# Patient Record
Sex: Female | Born: 1985
Health system: Southern US, Community
[De-identification: ages and names within clinical notes are randomized; demographics above are authoritative.]

## PROBLEM LIST (undated history)

## (undated) DIAGNOSIS — K219 Gastro-esophageal reflux disease without esophagitis: Secondary | ICD-10-CM

## (undated) DIAGNOSIS — R87629 Unspecified abnormal cytological findings in specimens from vagina: Secondary | ICD-10-CM

## (undated) DIAGNOSIS — Z789 Other specified health status: Secondary | ICD-10-CM

## (undated) DIAGNOSIS — Z309 Encounter for contraceptive management, unspecified: Secondary | ICD-10-CM

## (undated) DIAGNOSIS — J324 Chronic pansinusitis: Secondary | ICD-10-CM

## (undated) DIAGNOSIS — J45909 Unspecified asthma, uncomplicated: Secondary | ICD-10-CM

## (undated) DIAGNOSIS — IMO0002 Reserved for concepts with insufficient information to code with codable children: Secondary | ICD-10-CM

## (undated) HISTORY — PX: NO PAST SURGERIES: SHX2092

## (undated) HISTORY — PX: COLPOSCOPY: SHX161

## (undated) HISTORY — DX: Unspecified abnormal cytological findings in specimens from vagina: R87.629

## (undated) HISTORY — DX: Encounter for contraceptive management, unspecified: Z30.9

## (undated) HISTORY — DX: Chronic pansinusitis: J32.4

---

## 2008-04-03 ENCOUNTER — Other Ambulatory Visit: Admission: RE | Admit: 2008-04-03 | Discharge: 2008-04-03 | Payer: Self-pay | Admitting: Obstetrics and Gynecology

## 2008-10-09 ENCOUNTER — Other Ambulatory Visit: Admission: RE | Admit: 2008-10-09 | Discharge: 2008-10-09 | Payer: Self-pay | Admitting: Obstetrics and Gynecology

## 2009-04-25 ENCOUNTER — Other Ambulatory Visit: Admission: RE | Admit: 2009-04-25 | Discharge: 2009-04-25 | Payer: Self-pay | Admitting: Obstetrics and Gynecology

## 2009-12-24 ENCOUNTER — Other Ambulatory Visit: Admission: RE | Admit: 2009-12-24 | Discharge: 2009-12-24 | Payer: Self-pay | Admitting: Obstetrics and Gynecology

## 2010-07-08 ENCOUNTER — Other Ambulatory Visit: Admission: RE | Admit: 2010-07-08 | Discharge: 2010-07-08 | Payer: Self-pay | Admitting: Obstetrics and Gynecology

## 2010-09-24 LAB — RPR: RPR: NONREACTIVE

## 2010-09-24 LAB — ABO/RH: RH Type: POSITIVE

## 2010-12-15 NOTE — L&D Delivery Note (Signed)
Delivery Note At 11:56 PM a viable and healthy female was delivered via Vaginal, Spontaneous Delivery (Presentation loa ;  ).  APGAR: 6, 9; weight 7 lb 6.5 oz (3360 g).   Placenta status: Intact, Spontaneous.  Cord: 3 vessels with the following complications: None.  Cord pH: n/a  Anesthesia:  local Episiotomy: None Lacerations: Periurethral;2nd degree quite deep on rt side of urethra, through clitoral hood, repaired with 3 interrupted sutures.  Left side required only one suture of 4-0 vicryl Suture Repair: vicryl 4-0 Est. Blood Loss (mL):   Mom to postpartum.  Baby to nursery-stable. Delivery with DR Campbell Stall 07/03/2011, 12:24 AM

## 2011-07-02 ENCOUNTER — Encounter (HOSPITAL_COMMUNITY): Payer: Self-pay

## 2011-07-02 ENCOUNTER — Inpatient Hospital Stay (HOSPITAL_COMMUNITY)
Admission: AD | Admit: 2011-07-02 | Discharge: 2011-07-04 | DRG: 775 | Disposition: A | Discharge status: Home / Self Care | Payer: Self-pay | Source: Ambulatory Visit | Attending: Obstetrics and Gynecology | Admitting: Obstetrics and Gynecology

## 2011-07-02 DIAGNOSIS — IMO0001 Reserved for inherently not codable concepts without codable children: Secondary | ICD-10-CM

## 2011-07-02 DIAGNOSIS — Z3490 Encounter for supervision of normal pregnancy, unspecified, unspecified trimester: Secondary | ICD-10-CM

## 2011-07-02 HISTORY — DX: Other specified health status: Z78.9

## 2011-07-02 HISTORY — DX: Reserved for concepts with insufficient information to code with codable children: IMO0002

## 2011-07-02 LAB — RPR: RPR Ser Ql: NONREACTIVE

## 2011-07-02 LAB — CBC
Platelets: 260 10*3/uL (ref 150–400)
RDW: 12.2 % (ref 11.5–15.5)
WBC: 12.8 10*3/uL — ABNORMAL HIGH (ref 4.0–10.5)

## 2011-07-02 MED ORDER — LACTATED RINGERS IV SOLN: 500.0000 mL | INTRAVENOUS | Status: DC | PRN

## 2011-07-02 MED ORDER — FENTANYL CITRATE 0.05 MG/ML IJ SOLN: 100.0000 ug | INTRAMUSCULAR | Status: DC | PRN

## 2011-07-02 MED ORDER — OXYTOCIN 20 UNITS IN LACTATED RINGERS INFUSION - SIMPLE
1.0000 m[IU]/min | INTRAVENOUS | Status: DC
  Administered 2011-07-02: 2 m[IU]/min via INTRAVENOUS

## 2011-07-02 MED ORDER — ACETAMINOPHEN 325 MG PO TABS: 650.0000 mg | ORAL_TABLET | ORAL | Status: DC | PRN

## 2011-07-02 MED ORDER — FLEET ENEMA 7-19 GM/118ML RE ENEM: 1.0000 | ENEMA | RECTAL | Status: DC | PRN

## 2011-07-02 MED ORDER — IBUPROFEN 600 MG PO TABS
600.0000 mg | ORAL_TABLET | Freq: Four times a day (QID) | ORAL | Status: DC | PRN
  Filled 2011-07-02: qty 1

## 2011-07-02 MED ORDER — PRENATAL PLUS 27-1 MG PO TABS: 1.0000 | ORAL_TABLET | Freq: Every day | ORAL | Status: DC

## 2011-07-02 MED ORDER — TERBUTALINE SULFATE 1 MG/ML IJ SOLN: 0.2500 mg | Freq: Once | INTRAMUSCULAR | Status: AC | PRN

## 2011-07-02 MED ORDER — LIDOCAINE HCL (PF) 1 % IJ SOLN
30.0000 mL | INTRAMUSCULAR | Status: DC | PRN
  Filled 2011-07-02: qty 30

## 2011-07-02 MED ORDER — OXYTOCIN 20 UNITS IN LACTATED RINGERS INFUSION - SIMPLE
125.0000 mL/h | Freq: Once | INTRAVENOUS | Status: AC
  Administered 2011-07-03: 999 mL/h via INTRAVENOUS

## 2011-07-02 MED ORDER — OXYTOCIN 20 UNITS IN LACTATED RINGERS INFUSION - SIMPLE
INTRAVENOUS | Status: AC
  Administered 2011-07-02: 2 m[IU]/min via INTRAVENOUS
  Filled 2011-07-02: qty 1000

## 2011-07-02 MED ORDER — CITRIC ACID-SODIUM CITRATE 334-500 MG/5ML PO SOLN: 30.0000 mL | ORAL | Status: DC | PRN

## 2011-07-02 MED ORDER — OXYCODONE-ACETAMINOPHEN 5-325 MG PO TABS
2.0000 | ORAL_TABLET | ORAL | Status: DC | PRN
  Administered 2011-07-03: 1 via ORAL
  Filled 2011-07-02: qty 1

## 2011-07-02 MED ORDER — ONDANSETRON HCL 4 MG/2ML IJ SOLN: 4.0000 mg | Freq: Four times a day (QID) | INTRAMUSCULAR | Status: DC | PRN

## 2011-07-02 MED ORDER — LIDOCAINE HCL (PF) 1 % IJ SOLN
INTRAMUSCULAR | Status: AC
  Filled 2011-07-02: qty 30

## 2011-07-02 MED ORDER — LACTATED RINGERS IV SOLN
INTRAVENOUS | Status: DC
  Administered 2011-07-02 (×2): via INTRAVENOUS

## 2011-07-02 NOTE — Progress Notes (Signed)
Pt is here for labor eval. Denies lof . Report s good fetal movement.

## 2011-07-02 NOTE — Plan of Care (Signed)
Problem: Consults Goal: Birthing Suites Patient Information Press F2 to bring up selections list  Outcome: Completed/Met Date Met:  07/02/11  Pt 37-[redacted] weeks EGA     

## 2011-07-02 NOTE — Progress Notes (Signed)
Genelda Reist is a 25 y.o. G2P0010 at [redacted]w[redacted]d by LMP admitted for active labor  Subjective:  Patient tolerating labor well without epidural. AROM clear.  No urge to push.    Objective: BP 120/70  Pulse 76  Temp(Src) 97.8 F (36.6 C) (Axillary)  Resp 20  Ht 5' 5.5" (1.664 m)  Wt 76.204 kg (168 lb)  BMI 27.53 kg/m2      FHT:  FHR: 140 bpm, variability: moderate,  accelerations:  Abscent,  decelerations:  Absent UC:   regular, every 3 minutes SVE:   Dilation: 10 Effacement (%): 100 Station: -1 Exam by:: Dr. Emelda Fear  Labs: Lab Results  Component Value Date   WBC 12.8* 07/02/2011   HGB 12.3 07/02/2011   HCT 34.8* 07/02/2011   MCV 97.5 07/02/2011   PLT 260 07/02/2011    Assessment / Plan: Spontaneous labor, progressing normally  Labor: Progressing normally Preeclampsia:  n/a Fetal Wellbeing:  Category I Pain Control:  Labor support without medications I/D:  n/a Anticipated MOD:  NSVD  Talley Casco V 07/02/2011, 8:46 PM

## 2011-07-02 NOTE — H&P (Signed)
Sara Hamilton is a 25 y.o. female at [redacted]w[redacted]d presenting for labor. Maternal Medical History:  Reason for admission: Reason for admission: contractions.  Contractions: Onset was 3-5 hours ago.   Frequency: regular.   Perceived severity is strong.    Fetal activity: Perceived fetal activity is normal.   Last perceived fetal movement was within the past hour.    Prenatal complications: Bleeding: spotting x 1 hr.   Prenatal Complications - Diabetes: none.    OB History    Grav Para Term Preterm Abortions TAB SAB Ect Mult Living   2    1          Past Medical History  Diagnosis Date  . Abnormal Pap smear    Past Surgical History  Procedure Date  . Colposcopy    Family History: family history includes Cancer in her maternal grandmother and Heart defect in her sister. Social History:  reports that she has never smoked. She does not have any smokeless tobacco history on file. She reports that she does not drink alcohol or use illicit drugs.  Review of Systems  Constitutional: Negative for fever and chills.  Respiratory: Negative.   Cardiovascular: Negative.   Gastrointestinal: Negative.   Genitourinary:       + for spotting and contractions  Musculoskeletal: Negative.   Neurological: Negative.   Psychiatric/Behavioral: Negative.     Dilation: 7 Effacement (%): 90 Station: -1 Exam by:: Will Schier cnm Blood pressure 124/80, pulse 80, temperature 98.3 F (36.8 C), temperature source Oral, resp. rate 16, height 5' 5.5" (1.664 m), weight 76.204 kg (168 lb). Maternal Exam:  Uterine Assessment: Contraction strength is firm.  Contraction frequency is regular.   Introitus: Normal vulva. Ferning test: not done.  Nitrazine test: not done. Amniotic fluid character: not assessed.  Pelvis: adequate for delivery.   Cervix: Cervix evaluated by digital exam.   7/90/-1/BOWI  Fetal Exam Fetal Monitor Review: Baseline rate: 160.  Variability: minimal (<5 bpm).   Pattern: no  accelerations.    Fetal State Assessment: Category II - tracings are indeterminate.     Physical Exam  Nursing note and vitals reviewed. Constitutional: She is oriented to person, place, and time. She appears well-developed and well-nourished. No distress.  Cardiovascular: Normal rate.   Respiratory: Effort normal.  GI: Soft. There is no tenderness.  Neurological: She is alert and oriented to person, place, and time.  Skin: Skin is warm and dry.    Prenatal labs: ABO, Rh:   O pos   Antibody: Negative (10/11 0000) Rubella:  immune RPR: Nonreactive (10/11 0000)  HBsAg: Negative (10/11 0000)  HIV: Non-reactive (10/11 0000)  GBS: Negative (07/13 0000)   Assessment/Plan: 25 yo G1P0 @ [redacted] wks EGA in active labor, stable status Admit to L&D with routine labor orders, anticipate SVD   Sara Hamilton 07/02/2011, 5:46 PM

## 2011-07-02 NOTE — Progress Notes (Signed)
Irregular contractions yesterday not bad, today stronger and closer together every 4 minutes, G1   38 weeks, spotting about one hour ago

## 2011-07-03 ENCOUNTER — Encounter (HOSPITAL_COMMUNITY): Payer: Self-pay | Admitting: *Deleted

## 2011-07-03 LAB — CBC
HCT: 30.1 % — ABNORMAL LOW (ref 36.0–46.0)
Hemoglobin: 10.3 g/dL — ABNORMAL LOW (ref 12.0–15.0)
MCV: 98 fL (ref 78.0–100.0)
RDW: 12.3 % (ref 11.5–15.5)
WBC: 14.8 10*3/uL — ABNORMAL HIGH (ref 4.0–10.5)

## 2011-07-03 MED ORDER — LANOLIN HYDROUS EX OINT: TOPICAL_OINTMENT | CUTANEOUS | Status: DC | PRN

## 2011-07-03 MED ORDER — SODIUM CHLORIDE 0.9 % IJ SOLN
3.0000 mL | Freq: Two times a day (BID) | INTRAMUSCULAR | Status: DC
Start: 2011-07-03 — End: 2011-07-03

## 2011-07-03 MED ORDER — SODIUM CHLORIDE 0.9 % IV SOLN: 250.0000 mL | INTRAVENOUS | Status: DC

## 2011-07-03 MED ORDER — OXYTOCIN 20 UNITS IN LACTATED RINGERS INFUSION - SIMPLE: 1.0000 m[IU]/min | INTRAVENOUS | Status: DC

## 2011-07-03 MED ORDER — TETANUS-DIPHTH-ACELL PERTUSSIS 5-2.5-18.5 LF-MCG/0.5 IM SUSP
0.5000 mL | Freq: Once | INTRAMUSCULAR | Status: AC
  Administered 2011-07-04: 0.5 mL via INTRAMUSCULAR
  Filled 2011-07-03 (×2): qty 0.5

## 2011-07-03 MED ORDER — SIMETHICONE 80 MG PO CHEW: 80.0000 mg | CHEWABLE_TABLET | ORAL | Status: DC | PRN

## 2011-07-03 MED ORDER — ONDANSETRON HCL 4 MG PO TABS: 4.0000 mg | ORAL_TABLET | ORAL | Status: DC | PRN

## 2011-07-03 MED ORDER — TETANUS-DIPHTH-ACELL PERTUSSIS 5-2.5-18.5 LF-MCG/0.5 IM SUSP
0.5000 mL | Freq: Once | INTRAMUSCULAR | Status: DC
  Filled 2011-07-03: qty 0.5

## 2011-07-03 MED ORDER — PRENATAL PLUS 27-1 MG PO TABS
1.0000 | ORAL_TABLET | Freq: Every day | ORAL | Status: DC
  Administered 2011-07-04: 1 via ORAL
  Filled 2011-07-03 (×2): qty 1

## 2011-07-03 MED ORDER — IBUPROFEN 600 MG PO TABS
600.0000 mg | ORAL_TABLET | Freq: Four times a day (QID) | ORAL | Status: DC
  Administered 2011-07-03: 600 mg via ORAL

## 2011-07-03 MED ORDER — ZOLPIDEM TARTRATE 5 MG PO TABS: 5.0000 mg | ORAL_TABLET | Freq: Every evening | ORAL | Status: DC | PRN

## 2011-07-03 MED ORDER — IBUPROFEN 600 MG PO TABS
600.0000 mg | ORAL_TABLET | Freq: Four times a day (QID) | ORAL | Status: DC
  Administered 2011-07-03 – 2011-07-04 (×6): 600 mg via ORAL
  Filled 2011-07-03 (×6): qty 1

## 2011-07-03 MED ORDER — TERBUTALINE SULFATE 1 MG/ML IJ SOLN: 0.2500 mg | Freq: Once | INTRAMUSCULAR | Status: DC | PRN

## 2011-07-03 MED ORDER — OXYCODONE-ACETAMINOPHEN 5-325 MG PO TABS
1.0000 | ORAL_TABLET | ORAL | Status: DC | PRN
  Administered 2011-07-03 (×3): 1 via ORAL
  Filled 2011-07-03 (×4): qty 1

## 2011-07-03 MED ORDER — BENZOCAINE-MENTHOL 20-0.5 % EX AERO: 1.0000 "application " | INHALATION_SPRAY | CUTANEOUS | Status: DC | PRN

## 2011-07-03 MED ORDER — WITCH HAZEL-GLYCERIN EX PADS: MEDICATED_PAD | CUTANEOUS | Status: DC | PRN

## 2011-07-03 MED ORDER — OXYCODONE-ACETAMINOPHEN 5-325 MG PO TABS
1.0000 | ORAL_TABLET | ORAL | Status: DC | PRN
  Administered 2011-07-03: 1 via ORAL

## 2011-07-03 MED ORDER — ONDANSETRON HCL 4 MG/2ML IJ SOLN: 4.0000 mg | INTRAMUSCULAR | Status: DC | PRN

## 2011-07-03 MED ORDER — SENNOSIDES-DOCUSATE SODIUM 8.6-50 MG PO TABS: 1.0000 | ORAL_TABLET | Freq: Every day | ORAL | Status: DC

## 2011-07-03 MED ORDER — SODIUM CHLORIDE 0.9 % IJ SOLN: 3.0000 mL | INTRAMUSCULAR | Status: DC | PRN

## 2011-07-03 MED ORDER — OXYTOCIN 20 UNITS IN LACTATED RINGERS INFUSION - SIMPLE: 125.0000 mL/h | INTRAVENOUS | Status: DC | PRN

## 2011-07-03 MED ORDER — DIPHENHYDRAMINE HCL 25 MG PO CAPS: 25.0000 mg | ORAL_CAPSULE | Freq: Four times a day (QID) | ORAL | Status: DC | PRN

## 2011-07-03 MED ORDER — SENNOSIDES-DOCUSATE SODIUM 8.6-50 MG PO TABS
1.0000 | ORAL_TABLET | Freq: Every day | ORAL | Status: DC
  Administered 2011-07-03: 1 via ORAL

## 2011-07-03 MED ORDER — PRENATAL PLUS 27-1 MG PO TABS
1.0000 | ORAL_TABLET | Freq: Every day | ORAL | Status: DC
  Administered 2011-07-03: 1 via ORAL

## 2011-07-03 MED ORDER — BENZOCAINE-MENTHOL 20-0.5 % EX AERO
INHALATION_SPRAY | CUTANEOUS | Status: AC
  Administered 2011-07-03: 04:00:00
  Filled 2011-07-03: qty 56

## 2011-07-03 NOTE — Progress Notes (Signed)
Post Partum Day 1 Subjective: no complaints and voiding  Objective: Blood pressure 113/59, pulse 82, temperature 98 F (36.7 C), temperature source Oral, resp. rate 18, height 5' 5.5" (1.664 m), weight 76.204 kg (168 lb), unknown if currently breastfeeding.  Physical Exam:  General: alert, cooperative and no distress Lochia: appropriate Uterine Fundus: firm Incision: healing well at labial laceration DVT Evaluation: No evidence of DVT seen on physical exam.   Basename 07/03/11 0520 07/02/11 1555  HGB 10.3* 12.3  HCT 30.1* 34.8*    Assessment/Plan: Plan for discharge tomorrow/ bottle fdg/ bcp's   LOS: 1 day   Joslyne Marshburn V 07/03/2011, 8:48 AM

## 2011-07-03 NOTE — Discharge Summary (Signed)
Obstetric Discharge Summary Reason for Admission: onset of labor Prenatal Procedures: ultrasound Intrapartum Procedures: spontaneous vaginal delivery Postpartum Procedures: none Complications-Operative and Postpartum: none  Hemoglobin  Date Value Range Status  07/03/2011 10.3* 12.0-15.0 (g/dL) Final     HCT  Date Value Range Status  07/03/2011 30.1* 36.0-46.0 (%) Final    Discharge Diagnoses: Term Pregnancy-delivered  Discharge Information: Date: 07/03/2011 Activity: unrestricted Diet: routine Medications: Ibuprophen Condition: stable Instructions: refer to practice specific booklet and see avs Discharge to: home   Newborn Data: Live born  Information for the patient's newborn:  Louellen, Haldeman [[id number]]  female ; APGAR ,6/9 ; weight ; 7#6oz  CRESENZO-DISHMAN,Valree Feild 07/03/2011, 9:44 PM

## 2011-07-03 NOTE — Progress Notes (Signed)
UR chart review completed.  

## 2011-07-04 MED ORDER — IBUPROFEN 600 MG PO TABS: 600.0000 mg | ORAL_TABLET | Freq: Four times a day (QID) | ORAL | Status: AC

## 2011-07-04 MED ORDER — DOCUSATE SODIUM 100 MG PO CAPS: 100.0000 mg | ORAL_CAPSULE | Freq: Two times a day (BID) | ORAL | Status: AC

## 2011-07-04 NOTE — Progress Notes (Signed)
Infant in NICU and mother decided to pump and give breast milk. Gave lactation handbook along with nicu pumping handout . Mother and father teary about newborn in nicu. Mom has pumped one time with #24 flanges. She states she saw small amt of colostrum .Reviewed benefits of breastfeeding becauses mother wasn't planning to breastfeed. Encouraged to do skin to skjin with infant and begin breastfeeding as soon as allowed in nicu. Reviewed pumping every 3 hrs for 15 mins. inst to try power pumping when she gets home. Discussed pump rental or purchasing a new pump. Plans to go to store to look at new pumps. Encouraged to call lactation office for out patient visit  before infant goes home.

## 2011-07-04 NOTE — Progress Notes (Signed)
Post Partum Day 2 Subjective: no complaints, up ad lib, voiding, tolerating PO and + flatus  Objective: Blood pressure 115/73, pulse 77, temperature 98 F (36.7 C), temperature source Oral, resp. rate 18, height 5' 5.5" (1.664 m), weight 76.204 kg (168 lb), unknown if currently breastfeeding.  Physical Exam:  General: alert, cooperative and no distress Lochia: appropriate Uterine Fundus: firm Incision:  DVT Evaluation: No evidence of DVT seen on physical exam.   Basename 07/03/11 0520 07/02/11 1555  HGB 10.3* 12.3  HCT 30.1* 34.8*    Assessment/Plan: Pt strongly wants to stay one more night because her baby is in the NICU.  Pt is self pay. Will have care management talk with pt   LOS: 2 days   CRESENZO-DISHMAN,Marinus Eicher 07/04/2011, 8:04 AM

## 2011-07-08 NOTE — Progress Notes (Signed)
Pre and post weight done with breast feeding - infant transferred 6 mls. Mom's breast soft, milk dripping. Infant seems to be slipping off latch, not transferring despite 40 minutes of vigorous sucking at breast. Will do follow up tomorrow, and use nipple shield if necessary

## 2011-10-30 ENCOUNTER — Other Ambulatory Visit (HOSPITAL_COMMUNITY)
Admission: RE | Admit: 2011-10-30 | Discharge: 2011-10-30 | Disposition: A | Discharge status: Home / Self Care | Payer: 59 | Source: Ambulatory Visit | Attending: Obstetrics and Gynecology | Admitting: Obstetrics and Gynecology

## 2011-10-30 ENCOUNTER — Other Ambulatory Visit: Payer: Self-pay | Admitting: Adult Health

## 2011-10-30 DIAGNOSIS — Z01419 Encounter for gynecological examination (general) (routine) without abnormal findings: Secondary | ICD-10-CM | POA: Insufficient documentation

## 2012-11-03 ENCOUNTER — Other Ambulatory Visit: Payer: Self-pay | Admitting: Adult Health

## 2012-11-03 ENCOUNTER — Other Ambulatory Visit (HOSPITAL_COMMUNITY)
Admission: RE | Admit: 2012-11-03 | Discharge: 2012-11-03 | Disposition: A | Discharge status: Home / Self Care | Payer: 59 | Source: Ambulatory Visit | Attending: Obstetrics and Gynecology | Admitting: Obstetrics and Gynecology

## 2012-11-03 DIAGNOSIS — Z01419 Encounter for gynecological examination (general) (routine) without abnormal findings: Secondary | ICD-10-CM | POA: Insufficient documentation

## 2013-11-07 ENCOUNTER — Encounter: Payer: Self-pay | Admitting: Adult Health

## 2013-11-07 ENCOUNTER — Ambulatory Visit (INDEPENDENT_AMBULATORY_CARE_PROVIDER_SITE_OTHER): Payer: 59 | Admitting: Adult Health

## 2013-11-07 VITALS — BP 120/78 | HR 72 | Ht 65.0 in | Wt 145.0 lb

## 2013-11-07 DIAGNOSIS — Z01419 Encounter for gynecological examination (general) (routine) without abnormal findings: Secondary | ICD-10-CM

## 2013-11-07 DIAGNOSIS — Z309 Encounter for contraceptive management, unspecified: Secondary | ICD-10-CM | POA: Insufficient documentation

## 2013-11-07 MED ORDER — NORETHIN ACE-ETH ESTRAD-FE 1.5-30 MG-MCG PO TABS: 1.0000 | ORAL_TABLET | Freq: Every day | ORAL | Status: DC

## 2013-11-07 NOTE — Patient Instructions (Signed)
Physical in 1 year Mammogram at 39 Continue pills

## 2013-11-07 NOTE — Progress Notes (Signed)
Patient ID: Sara Hamilton, female   DOB: Nov 22, 1986, 27 y.o.   MRN: [id number] History of Present Illness: Sara Hamilton is a 27 year old white female, married.Had normal pap 11/03/12.   Current Medications, Allergies, Past Medical History, Past Surgical History, Family History and Social History were reviewed in [employer] record.   Past Medical History  Diagnosis Date  . Abnormal Pap smear   . No pertinent past medical history   . Contraceptive management 11/07/2013  Current outpatient prescriptions:norethindrone-ethinyl estradiol-iron (MICROGESTIN FE 1.5/30) 1.5-30 MG-MCG tablet, Take 1 tablet by mouth daily., Disp: 1 Package, Rfl: 11  Past Surgical History  Procedure Laterality Date  . Colposcopy    . No past surgeries      Review of Systems: Patient denies any headaches, blurred vision, shortness of breath, chest pain, abdominal pain, problems with bowel movements, urination, or intercourse. No joint pain or moods wings.May want another baby in future.   Physical Exam:BP 120/78  Pulse 72  Ht 5\' 5"  (1.651 m)  Wt 145 lb (65.772 kg)  BMI 24.13 kg/m2  LMP 10/24/2013  Breastfeeding? No General:  Well developed, well nourished, no acute distress Skin:  Warm and dry Neck:  Midline trachea, normal thyroid Lungs; Clear to auscultation bilaterally Breast:  No dominant palpable mass, retraction, or nipple discharge Cardiovascular: Regular rate and rhythm Abdomen:  Soft, non tender, no hepatosplenomegaly Pelvic:  External genitalia is normal in appearance.  The vagina is normal in appearance.  The cervix is bulbous.  Uterus is felt to be normal size, shape, and contour.  No  adnexal masses or tenderness noted. Extremities:  No swelling or varicosities noted Psych:  No mood changes, alert and cooperative, seems happy   Impression: Yearly exam no pap Contraceptive management    Plan: Physical and pap in 1 year Refilled Loestrin 1.5/30 x 1 year When decides to try  to get pregnant, stop pills for 3 months prior to trying and take prenatal vitamin with folic acid

## 2014-03-28 ENCOUNTER — Other Ambulatory Visit: Payer: Self-pay | Admitting: Adult Health

## 2014-10-16 ENCOUNTER — Encounter: Payer: Self-pay | Admitting: Adult Health

## 2015-05-20 ENCOUNTER — Other Ambulatory Visit: Payer: Self-pay | Admitting: Adult Health

## 2015-05-21 ENCOUNTER — Other Ambulatory Visit: Payer: Self-pay | Admitting: Adult Health

## 2015-07-12 ENCOUNTER — Encounter: Payer: Self-pay | Admitting: Adult Health

## 2015-07-12 ENCOUNTER — Ambulatory Visit (INDEPENDENT_AMBULATORY_CARE_PROVIDER_SITE_OTHER): Payer: 59 | Admitting: Adult Health

## 2015-07-12 ENCOUNTER — Other Ambulatory Visit (HOSPITAL_COMMUNITY)
Admission: RE | Admit: 2015-07-12 | Discharge: 2015-07-12 | Disposition: A | Discharge status: Home / Self Care | Payer: 59 | Source: Ambulatory Visit | Attending: Adult Health | Admitting: Adult Health

## 2015-07-12 VITALS — BP 110/70 | HR 72 | Ht 65.5 in | Wt 145.0 lb

## 2015-07-12 DIAGNOSIS — Z1151 Encounter for screening for human papillomavirus (HPV): Secondary | ICD-10-CM | POA: Insufficient documentation

## 2015-07-12 DIAGNOSIS — Z01419 Encounter for gynecological examination (general) (routine) without abnormal findings: Secondary | ICD-10-CM | POA: Diagnosis not present

## 2015-07-12 DIAGNOSIS — Z3041 Encounter for surveillance of contraceptive pills: Secondary | ICD-10-CM

## 2015-07-12 MED ORDER — NORETHIN ACE-ETH ESTRAD-FE 1.5-30 MG-MCG PO TABS: 1.0000 | ORAL_TABLET | Freq: Every day | ORAL | Status: DC

## 2015-07-12 NOTE — Progress Notes (Signed)
Patient ID: Sara Hamilton, female   DOB: [birth year], 29 y.o.   MRN: [id number] History of Present Illness:  Sara Hamilton is a 29 year old white female, married in for well woman gyn exam and pap, no complaints.Needs OCs refilled.G2 P1, son is 92 yo.  Current Medications, Allergies, Past Medical History, Past Surgical History, Family History and Social History were reviewed in [employer] record.     Review of Systems: Patient denies any headaches, hearing loss, fatigue, blurred vision, shortness of breath, chest pain, abdominal pain, problems with bowel movements, urination, or intercourse. No joint pain or mood swings.    Physical Exam:BP 110/70 mmHg  Pulse 72  Ht 5' 5.5" (1.664 m)  Wt 145 lb (65.772 kg)  BMI 23.75 kg/m2  LMP 06/21/2015 General:  Well developed, well nourished, no acute distress Skin:  Warm and dry Neck:  Midline trachea, normal thyroid, good ROM, no lymphadenopathy Lungs; Clear to auscultation bilaterally Breast:  No dominant palpable mass, retraction, or nipple discharge Cardiovascular: Regular rate and rhythm Abdomen:  Soft, non tender, no hepatosplenomegaly Pelvic:  External genitalia is normal in appearance, no lesions.  The vagina is normal in appearance. Urethra has no lesions or masses. The cervix is bulbous, everted at os, pap with HPV performed.  Uterus is felt to be normal size, shape, and contour.  No adnexal masses or tenderness noted.Bladder is non tender, no masses felt. Extremities/musculoskeletal:  No swelling or varicosities noted, no clubbing or cyanosis Psych:  No mood changes, alert and cooperative,seems happy   Impression: Well woman gyn exam with pap Contraceptive management    Plan: Refilled microgestin 1.5/30 fe x  1 year Physical in 1 year Pap in 3 if normal with negative HPV Fasting labs next year Mammogram at 39

## 2015-07-12 NOTE — Patient Instructions (Signed)
Physical in 1 year Pap in 3 years if normal with negative HPV Mammogram at 57

## 2015-07-17 LAB — CYTOLOGY - PAP

## 2016-05-20 ENCOUNTER — Other Ambulatory Visit: Payer: Self-pay | Admitting: Adult Health

## 2016-11-14 ENCOUNTER — Other Ambulatory Visit: Payer: 59 | Admitting: Adult Health

## 2016-11-27 ENCOUNTER — Ambulatory Visit (INDEPENDENT_AMBULATORY_CARE_PROVIDER_SITE_OTHER): Payer: BLUE CROSS/BLUE SHIELD | Admitting: Adult Health

## 2016-11-27 ENCOUNTER — Encounter: Payer: Self-pay | Admitting: Adult Health

## 2016-11-27 VITALS — BP 122/68 | HR 70 | Ht 66.25 in | Wt 167.5 lb

## 2016-11-27 DIAGNOSIS — Z01419 Encounter for gynecological examination (general) (routine) without abnormal findings: Secondary | ICD-10-CM | POA: Diagnosis not present

## 2016-11-27 DIAGNOSIS — Z3041 Encounter for surveillance of contraceptive pills: Secondary | ICD-10-CM

## 2016-11-27 MED ORDER — NORETHIN ACE-ETH ESTRAD-FE 1.5-30 MG-MCG PO TABS: 1.0000 | ORAL_TABLET | Freq: Every day | ORAL | 4 refills | Status: DC

## 2016-11-27 NOTE — Progress Notes (Signed)
Patient ID: Sara Hamilton, female   DOB: Aug 30, 1986, 30 y.o.   MRN: [id number] History of Present Illness: Sara Hamilton is a 30 year old white female,married in for a well woman gyn exam and refill OCs.Had normal pap with negative HPV 07/12/15.   Current Medications, Allergies, Past Medical History, Past Surgical History, Family History and Social History were reviewed in [facility] electronic medical record.     Review of Systems: Patient denies any headaches, hearing loss, fatigue, blurred vision, shortness of breath, chest pain, abdominal pain, problems with bowel movements, urination, or intercourse. No joint pain or mood swings.Has gained some weight, husband has too.    Physical Exam:BP 122/68 (BP Location: Left Arm, Patient Position: Sitting, Cuff Size: Normal)   Pulse 70   Ht 5' 6.25" (1.683 m)   Wt 167 lb 8 oz (76 kg)   LMP 11/06/2016 (Approximate)   BMI 26.83 kg/m  General:  Well developed, well nourished, no acute distress Skin:  Warm and dry Neck:  Midline trachea, normal thyroid, good ROM, no lymphadenopathy Lungs; Clear to auscultation bilaterally Breast:  No dominant palpable mass, retraction, or nipple discharge Cardiovascular: Regular rate and rhythm Abdomen:  Soft, non tender, no hepatosplenomegaly Pelvic:  External genitalia is normal in appearance, no lesions.  The vagina is normal in appearance. Urethra has no lesions or masses. The cervix is bulbous.  Uterus is felt to be normal size, shape, and contour.  No adnexal masses or tenderness noted.Bladder is non tender, no masses felt. Extremities/musculoskeletal:  No swelling or varicosities noted, no clubbing or cyanosis Psych:  No mood changes, alert and cooperative,seems happy PHQ 2 score 0  Impression: 1. Well woman exam with routine gynecological exam   2. Encounter for surveillance of contraceptive pills       Plan:  Refilled loestrin 1.5/30 Fe #84 with 4 refills, take 1 daily Physical in 1 year Pap in  2019 Labs next year

## 2016-11-27 NOTE — Patient Instructions (Signed)
Physical in 1 year Pap in 2019

## 2017-04-20 ENCOUNTER — Other Ambulatory Visit: Payer: Self-pay | Admitting: Adult Health

## 2017-06-29 MED FILL — LARIN FE 1.5-30 TABLET: 1.5-30 | 84 days supply | Qty: 84 | Fill #0

## 2017-09-09 DIAGNOSIS — Z23 Encounter for immunization: Secondary | ICD-10-CM | POA: Diagnosis not present

## 2017-09-15 MED FILL — LARIN FE 1.5-30 TABLET: 1.5-30 | 84 days supply | Qty: 84 | Fill #1

## 2017-11-30 ENCOUNTER — Ambulatory Visit (INDEPENDENT_AMBULATORY_CARE_PROVIDER_SITE_OTHER): Payer: 59 | Admitting: Adult Health

## 2017-11-30 ENCOUNTER — Encounter: Payer: Self-pay | Admitting: Adult Health

## 2017-11-30 VITALS — BP 120/60 | HR 83 | Ht 66.5 in | Wt 163.0 lb

## 2017-11-30 DIAGNOSIS — Z01419 Encounter for gynecological examination (general) (routine) without abnormal findings: Secondary | ICD-10-CM | POA: Diagnosis not present

## 2017-11-30 DIAGNOSIS — Z3041 Encounter for surveillance of contraceptive pills: Secondary | ICD-10-CM | POA: Diagnosis not present

## 2017-11-30 MED ORDER — NORETHIN ACE-ETH ESTRAD-FE 1.5-30 MG-MCG PO TABS: 1.0000 | ORAL_TABLET | Freq: Every day | ORAL | 4 refills | Status: DC

## 2017-11-30 MED FILL — LARIN FE 1.5-30 TABLET: 1.5-30 | 84 days supply | Qty: 84 | Fill #0

## 2017-11-30 NOTE — Progress Notes (Signed)
Patient ID: Sara Hamilton, female   DOB: 1986-11-18, 31 y.o.   MRN: [id number] History of Present Illness:  Sara Hamilton is a 31 year old white female, married in for a well woman gyn exam, she had a normal pap with negative HPV, 07/12/15.Working at [employer] in [place].  PCP is Sara Hamilton.   Current Medications, Allergies, Past Medical History, Past Surgical History, Family History and Social History were reviewed in [facility] electronic medical record.     Review of Systems: Patient denies any headaches, hearing loss, fatigue, blurred vision, shortness of breath, chest pain, abdominal pain, problems with bowel movements, urination, or intercourse. No joint pain or mood swings.she is happy with her OCs.    Physical Exam:BP 120/60 (BP Location: Left Arm, Patient Position: Sitting, Cuff Size: Normal)   Pulse 83   Ht 5' 6.5" (1.689 m)   Wt 163 lb (73.9 kg)   LMP 11/21/2017 (Exact Date)   BMI 25.91 kg/m  General:  Well developed, well nourished, no acute distress Skin:  Warm and dry Neck:  Midline trachea, normal thyroid, good ROM, no lymphadenopathy Lungs; Clear to auscultation bilaterally Breast:  No dominant palpable mass, retraction, or nipple discharge Cardiovascular: Regular rate and rhythm Abdomen:  Soft, non tender, no hepatosplenomegaly Pelvic:  External genitalia is normal in appearance, no lesions.  The vagina is normal in appearance. Urethra has no lesions or masses. The cervix is bulbous.  Uterus is felt to be normal size, shape, and contour.  No adnexal masses or tenderness noted.Bladder is non tender, no masses felt. Extremities/musculoskeletal:  No swelling or varicosities noted, no clubbing or cyanosis Psych:  No mood changes, alert and cooperative,seems happy PHQ 2 score 0.  Impression:  1. Well woman exam with routine gynecological exam   2. Encounter for surveillance of contraceptive pills      Plan: Meds ordered this encounter  Medications  . norethindrone-ethinyl  estradiol-iron (BLISOVI FE 1.5/30) 1.5-30 MG-MCG tablet    Sig: Take 1 tablet by mouth daily.    Dispense:  84 tablet    Refill:  4    Order Specific Question:   Supervising Provider    Answer:   Duane LopeEURE, LUTHER H [[id number]]  Pap and physical in 1 year Labs with PCP

## 2017-12-16 ENCOUNTER — Ambulatory Visit (INDEPENDENT_AMBULATORY_CARE_PROVIDER_SITE_OTHER): Payer: Self-pay | Admitting: Emergency Medicine

## 2017-12-16 VITALS — BP 90/70 | HR 84 | Temp 98.5°F | Resp 16 | Wt 165.2 lb

## 2017-12-16 DIAGNOSIS — J069 Acute upper respiratory infection, unspecified: Secondary | ICD-10-CM

## 2017-12-16 MED ORDER — AMOXICILLIN-POT CLAVULANATE 875-125 MG PO TABS: 1.0000 | ORAL_TABLET | Freq: Two times a day (BID) | ORAL | 0 refills | Status: DC

## 2017-12-16 MED ORDER — IPRATROPIUM BROMIDE 0.06 % NA SOLN: 2.0000 | Freq: Four times a day (QID) | NASAL | 0 refills | Status: DC

## 2017-12-16 MED FILL — IPRATROPIUM 0.06% SPRAY: 0.06 | 30 days supply | Qty: 15 | Fill #0

## 2017-12-16 NOTE — Patient Instructions (Addendum)
Rest, fluids, over the counter flonase or Pseudopod for symptom relief, delay fill antibiotics, recommend waiting till 12/20/2017 to fill, Tylenol or Ibuprofen as needed for fever, headaches, or body aches, return if symptoms worsen.  Viral Respiratory Infection A viral respiratory infection is an illness that affects parts of the body used for breathing, like the lungs, nose, and throat. It is caused by a germ called a virus. Some examples of this kind of infection are:  A cold.  The flu (influenza).  A respiratory syncytial virus (RSV) infection.  How do I know if I have this infection? Most of the time this infection causes:  A stuffy or runny nose.  Yellow or green fluid in the nose.  A cough.  Sneezing.  Tiredness (fatigue).  Achy muscles.  A sore throat.  Sweating or chills.  A fever.  A headache.  How is this infection treated? If the flu is diagnosed early, it may be treated with an antiviral medicine. This medicine shortens the length of time a person has symptoms. Symptoms may be treated with over-the-counter and prescription medicines, such as:  Expectorants. These make it easier to cough up mucus.  Decongestant nasal sprays.  Doctors do not prescribe antibiotic medicines for viral infections. They do not work with this kind of infection. How do I know if I should stay home? To keep others from getting sick, stay home if you have:  A fever.  A lasting cough.  A sore throat.  A runny nose.  Sneezing.  Muscles aches.  Headaches.  Tiredness.  Weakness.  Chills.  Sweating.  An upset stomach (nausea).  Follow these instructions at home:  Rest as much as possible.  Take over-the-counter and prescription medicines only as told by your doctor.  Drink enough fluid to keep your pee (urine) clear or pale yellow.  Gargle with salt water. Do this 3-4 times per day or as needed. To make a salt-water mixture, dissolve -1 tsp of salt in 1 cup of  warm water. Make sure the salt dissolves all the way.  Use nose drops made from salt water. This helps with stuffiness (congestion). It also helps soften the skin around your nose.  Do not drink alcohol.  Do not use tobacco products, including cigarettes, chewing tobacco, and e-cigarettes. If you need help quitting, ask your doctor. Get help if:  Your symptoms last for 10 days or longer.  Your symptoms get worse over time.  You have a fever.  You have very bad pain in your face or forehead.  Parts of your jaw or neck become very swollen. Get help right away if:  You feel pain or pressure in your chest.  You have shortness of breath.  You faint or feel like you will faint.  You keep throwing up (vomiting).  You feel confused. This information is not intended to replace advice given to you by your health care provider. Make sure you discuss any questions you have with your health care provider. Document Released: 11/13/2008 Document Revised: 05/08/2016 Document Reviewed: 05/09/2015 Elsevier Interactive Patient Education  2018 [employer]

## 2017-12-16 NOTE — Progress Notes (Signed)
Sara Hamilton is a 32 y.o. female who presents for evaluation of URI like symptoms.  Symptoms include achiness, congestion, cough described as nonproductive, fever: fevers up to 100 degrees, nasal congestion, post nasal drip, sinus pain and sneezing.  Onset of symptoms was 4 days ago, and has been gradually worsening since that time.  Treatment to date:  acetaminophen, cough suppressants and reports she did an e-visit on the first day and was given Rx of tessalon.  High risk factors for influenza complications:  none.  The following portions of the patient's history were reviewed and updated as appropriate:  allergies, current medications and past medical history.  Review of Systems  Constitutional: Positive for chills, fever and malaise/fatigue.  HENT: Positive for congestion and sinus pain. Negative for ear discharge, ear pain and sore throat.   Respiratory: Positive for cough. Negative for shortness of breath and wheezing.   Cardiovascular: Negative for chest pain and palpitations.  Gastrointestinal: Negative for abdominal pain, diarrhea, nausea and vomiting.  Genitourinary: Negative.   Musculoskeletal: Positive for myalgias. Negative for back pain and neck pain.  Skin: Negative.   Neurological: Negative.     Objective:   Vitals:   12/16/17 1003  BP: 90/70  Pulse: 84  Resp: 16  Temp: 98.5 F (36.9 C)  SpO2: 98%    Physical Exam  Constitutional: No distress.  HENT:  Head: Normocephalic and atraumatic.  Right Ear: Tympanic membrane and external ear normal.  Left Ear: Tympanic membrane and external ear normal.  Nose: Rhinorrhea present. Right sinus exhibits no maxillary sinus tenderness and no frontal sinus tenderness. Left sinus exhibits no maxillary sinus tenderness and no frontal sinus tenderness.  Mouth/Throat: Uvula is midline and oropharynx is clear and moist. No oropharyngeal exudate.  Eyes: Conjunctivae are normal.  Neck: Neck supple.  Cardiovascular: Normal rate, regular  rhythm and normal heart sounds.  Pulmonary/Chest: Effort normal. She has no wheezes.  Lymphadenopathy:    She has no cervical adenopathy.  Neurological: She is alert.  Skin: Skin is warm and dry. She is not diaphoretic.  Psychiatric: Affect normal.  Nursing note and vitals reviewed.    Assessment:   1. URI with cough and congestion       Plan:   1. URI with cough and congestion -recommend rest, fluids, OTC medicines for symptom relief, delay fill antibiotic if symptoms persist, return as needed, AVS printed - amoxicillin-clavulanate (AUGMENTIN) 875-125 MG tablet; Take 1 tablet by mouth 2 (two) times daily. Recommend waiting till 12/20/2017 to fill  Dispense: 20 tablet; Refill: 0 - ipratropium (ATROVENT) 0.06 % nasal spray; Place 2 sprays into both nostrils 4 (four) times daily.  Dispense: 15 mL; Refill: 0

## 2017-12-18 ENCOUNTER — Telehealth: Payer: Self-pay

## 2018-01-21 MED FILL — NITROFURANTOIN MONO-MCR 100: 100 | 5 days supply | Qty: 10 | Fill #0

## 2018-01-28 DIAGNOSIS — E663 Overweight: Secondary | ICD-10-CM | POA: Diagnosis not present

## 2018-01-28 DIAGNOSIS — Z6826 Body mass index (BMI) 26.0-26.9, adult: Secondary | ICD-10-CM | POA: Diagnosis not present

## 2018-01-28 DIAGNOSIS — Z1389 Encounter for screening for other disorder: Secondary | ICD-10-CM | POA: Diagnosis not present

## 2018-01-28 DIAGNOSIS — J209 Acute bronchitis, unspecified: Secondary | ICD-10-CM | POA: Diagnosis not present

## 2018-02-24 MED FILL — LARIN FE 1.5-30 TABLET: 1.5-30 | 84 days supply | Qty: 84 | Fill #1

## 2018-03-15 ENCOUNTER — Other Ambulatory Visit (HOSPITAL_COMMUNITY): Payer: Self-pay | Admitting: Physician Assistant

## 2018-03-15 ENCOUNTER — Ambulatory Visit (HOSPITAL_COMMUNITY)
Admission: RE | Admit: 2018-03-15 | Discharge: 2018-03-15 | Disposition: A | Discharge status: Home / Self Care | Payer: 59 | Source: Ambulatory Visit | Attending: Physician Assistant | Admitting: Physician Assistant

## 2018-03-15 DIAGNOSIS — Z6827 Body mass index (BMI) 27.0-27.9, adult: Secondary | ICD-10-CM | POA: Diagnosis not present

## 2018-03-15 DIAGNOSIS — Z1389 Encounter for screening for other disorder: Secondary | ICD-10-CM | POA: Diagnosis not present

## 2018-03-15 DIAGNOSIS — R05 Cough: Secondary | ICD-10-CM

## 2018-03-15 DIAGNOSIS — R059 Cough, unspecified: Secondary | ICD-10-CM

## 2018-03-15 DIAGNOSIS — E663 Overweight: Secondary | ICD-10-CM | POA: Diagnosis not present

## 2018-03-24 MED FILL — PROAIR HFA 90 MCG INHALER: 108 (90 BAS | 17 days supply | Qty: 9 | Fill #0

## 2018-03-24 MED FILL — BENZONATATE 200 MG CAPSULE: 200 | 10 days supply | Qty: 30 | Fill #0

## 2018-03-24 MED FILL — predniSONE 10 MG TABS: 10 | 15 days supply | Qty: 48 | Fill #0

## 2018-04-15 MED FILL — OMEPRAZOLE 20 MG CAP: 20 | 14 days supply | Qty: 28 | Fill #0

## 2018-04-26 MED FILL — PROAIR HFA 90 MCG INHALER: 108 (90 BAS | 17 days supply | Qty: 9 | Fill #1

## 2018-04-28 ENCOUNTER — Ambulatory Visit: Payer: 59 | Admitting: Allergy & Immunology

## 2018-04-28 ENCOUNTER — Encounter: Payer: Self-pay | Admitting: Allergy & Immunology

## 2018-04-28 VITALS — BP 106/80 | HR 76 | Resp 16

## 2018-04-28 DIAGNOSIS — J302 Other seasonal allergic rhinitis: Secondary | ICD-10-CM | POA: Diagnosis not present

## 2018-04-28 DIAGNOSIS — R05 Cough: Secondary | ICD-10-CM | POA: Diagnosis not present

## 2018-04-28 DIAGNOSIS — R053 Chronic cough: Secondary | ICD-10-CM

## 2018-04-28 DIAGNOSIS — J3089 Other allergic rhinitis: Secondary | ICD-10-CM | POA: Diagnosis not present

## 2018-04-28 DIAGNOSIS — J4540 Moderate persistent asthma, uncomplicated: Secondary | ICD-10-CM | POA: Diagnosis not present

## 2018-04-28 MED ORDER — MONTELUKAST SODIUM 10 MG PO TABS: 10.0000 mg | ORAL_TABLET | Freq: Every day | ORAL | 5 refills | Status: DC

## 2018-04-28 MED ORDER — TRIAMCINOLONE ACETONIDE 55 MCG/ACT NA AERO: 2.0000 | INHALATION_SPRAY | Freq: Every day | NASAL | 5 refills | Status: DC

## 2018-04-28 MED ORDER — BUDESONIDE-FORMOTEROL FUMARATE 160-4.5 MCG/ACT IN AERO: 2.0000 | INHALATION_SPRAY | Freq: Two times a day (BID) | RESPIRATORY_TRACT | 5 refills | Status: DC

## 2018-04-28 MED ORDER — ALBUTEROL SULFATE HFA 108 (90 BASE) MCG/ACT IN AERS: 2.0000 | INHALATION_SPRAY | RESPIRATORY_TRACT | 1 refills | Status: DC | PRN

## 2018-04-28 NOTE — Progress Notes (Signed)
NEW PATIENT  Date of Service/Encounter:  04/28/18  Referring provider: Patient, No Pcp Per   Assessment:   Cough - possible moderate persistent asthma  Seasonal and perennial allergic rhinitis (mouse, [place] pig, weeds, grasses, outdoor molds and dust mites)   Asthma Reportables:  Severity: moderate persistent  Risk: high Control: very poorly controlled  [place] has a persistent cough. Spirometry reveals reversibility with bronchodilator, although it does not quite meet statistical significant per ATS criteria. [place]'s cough is most likely multi-factorial. Based upon history and physical examination, I suspect post nasal drainage is contributing (upper airway cough syndrome) and likely uncontrolled asthma. She has both reversibility as well as clinicaly improvement with the albuterol treatment. She may have had some underlying undiagnosed asthma prior to contracting a viral infection that triggered an acute worsening of her symptoms. The [place] pig exposure might have prolonged her symptoms even more. I doubt GERD as a cause of this cough since a trial of a PPI did not help at all. We are going to aggressively control her atopic disease and see her back to see how she is doing with a short follow up.    Plan/Recommendations:   1. Moderate persistent asthma, uncomplicated - Lung testing showed some mild restriction, and there was improvement with the nebulizer treatment (this was not significant per ATS criteria, however). - I think a trial of an ICS/LABA will be helpful to further diagnosis this: Symbicort 160/4.5 - Spacer sample and demonstration provided. - Daily controller medication(s): Singulair  daily and Symbicort 160/4.99mcg two puffs twice daily with spacer - Prior to physical activity: ProAir 2 puffs 10-15 minutes before physical activity. - Rescue medications: ProAir 4 puffs every 4-6 hours as needed - Asthma control goals:  * Full participation in all desired  activities (may need albuterol before activity) * Albuterol use two time or less a week on average (not counting use with activity) * Cough interfering with sleep two time or less a month * Oral steroids no more than once a year * No hospitalizations  2. Seasonal and perennial allergic rhinitis - Testing today showed: mouse, [place] pig, weeds, grasses, outdoor molds and dust mites  - Make someone else change the [place] pig litter.  - Avoidance measures provided. - Continue with: Allegra (fexofenadine)  table once daily - Start taking: Singulair (montelukast)  daily and Nasacort (triamcinolone) two sprays per nostril daily - You can use an extra dose of the antihistamine, if needed, for breakthrough symptoms.  - Consider nasal saline rinses 1-2 times daily to remove allergens from the nasal cavities as well as help with mucous clearance (this is especially helpful to do before the nasal sprays are given) - Consider allergy shots as a means of long-term control. - Allergy shots "re-train" and "reset" the immune system to ignore environmental allergens and decrease the resulting immune response to those allergens (sneezing, itchy watery eyes, runny nose, nasal congestion, etc).     3. Return in about 4 weeks (around 05/25/2018) for for office visit (11:15am).  Subjective:   [place] is a 32 y.o. female presenting today for evaluation of  Chief Complaint  Patient presents with  . Cough    pt has had for 5 months Feb & April Predisone    [place] has a history of the following: Patient Active Problem List   Diagnosis Date Noted  . Contraceptive management 11/07/2013  . Pregnancy, supervision of normal 07/02/2011    History obtained from: chart review and  patient.  [place] was referred by Patient, No Pcp Per.     S[place] is a 32 y.o. female presenting for an evaluation of a chronic cough. She became sick in December 2018 and the coughing has persisted despite this.  She has been on prednisone twice (15 days in Feb and 15 days in April). It always seems to help but it always comes back. She has been on albuterol and has used it for two days. However, she has never used nebulizer treatments since the coughing started.    Of note they got [place] pigs two months ago. Otherwise there has been no new exposures in the home. There is no smoking at all.  She did get one course of antibiotics in January. She thinks that it was on Augmentin. She was treated with a PPI for nearly two weeks without improvement; she stopped it since there was no improvement in her cough. Tussionex, Delsum, and tessalon pearls do not seem to help.   She does have seasonal allergies and uses Allegra as needed. She has noticed worsening symptoms since stopping the Allegra. She has used nasal sprays in the past but is not using them now.   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 She has been able to work but has been using cough drops. 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   Otherwise, there is no history of other atopic diseases, including drug allergies, food allergies, stinging insect allergies, or urticaria. There is no significant infectious history. Vaccinations are up to date.    Past Medical History: Patient Active Problem List   Diagnosis Date Noted  . Contraceptive management 11/07/2013  . Pregnancy, supervision of normal 07/02/2011    Medication List:  Allergies as of 04/28/2018   No Known Allergies     Medication List        Accurate as of 04/28/18  9:18 PM. Always use your most recent med list.          PROAIR HFA 108 (90 Base) MCG/ACT inhaler Generic drug:  albuterol INHALE 2 PUFFS BY MOUTH INTO THE LUNGS EVERY 4 HOURS AS NEEDED   albuterol 108 (90 Base) MCG/ACT inhaler Commonly known as:  PROAIR HFA Inhale 2 puffs into the lungs every 4 (four) hours as needed for  wheezing or shortness of breath.   budesonide-formoterol 160-4.5 MCG/ACT inhaler Commonly known as:  SYMBICORT Inhale 2 puffs into the lungs 2 (two) times daily.   montelukast 10 MG tablet Commonly known as:  SINGULAIR Take 1 tablet (10 mg total) by mouth at bedtime.   norethindrone-ethinyl estradiol-iron 1.5-30 MG-MCG tablet Commonly known as:  BLISOVI FE 1.5/30 Take 1 tablet by mouth daily.   triamcinolone 55 MCG/ACT Aero nasal inhaler Commonly known as:  NASACORT Place 2 sprays into the nose daily.       Birth History: non-contributory.   Developmental History: non-contributory.   Past Surgical History: Past Surgical History:  Procedure Laterality Date  . COLPOSCOPY    . NO PAST SURGERIES       Family History: Family History  Problem Relation Age of Onset  . Heart defect Sister   . Cancer Maternal Grandmother   . Cancer Paternal Grandmother   . Diabetes Paternal Grandmother      Social History: [place] lives at home with her husband and four children and two [place] pigs. They live in  a house that is 33 years old. There are hardwoods throughout the home. They have electric and gas heating and central cooling. They have two [place] pigs at home. There are no dust mite coverings on the bedding. There is no tobacco exposure in the home. She currently works as a Buyer, retail at [employer].     Review of Systems: a 14-point review of systems is pertinent for what is mentioned in HPI.  Otherwise, all other systems were negative. Constitutional: negative other than that listed in the HPI Eyes: negative other than that listed in the HPI Ears, nose, mouth, throat, and face: negative other than that listed in the HPI Respiratory: negative other than that listed in the HPI Cardiovascular: negative other than that listed in the HPI Gastrointestinal: negative other than that listed in the HPI Genitourinary: negative other than that listed in the  HPI Integument: negative other than that listed in the HPI Hematologic: negative other than that listed in the HPI Musculoskeletal: negative other than that listed in the HPI Neurological: negative other than that listed in the HPI Allergy/Immunologic: negative other than that listed in the HPI    Objective:   Blood pressure 106/80, pulse 76, resp. rate 16, SpO2 98 %. There is no height or weight on file to calculate BMI.   Physical Exam:  General: Alert, interactive, in no acute distress. Persistently coughing during the exam. Fatigued.  Eyes: No conjunctival injection bilaterally, no discharge on the right, no discharge on the left and no Horner-Trantas dots present. PERRL bilaterally. EOMI without pain. No photophobia.  Ears: Right TM pearly gray with normal light reflex, Left TM pearly gray with normal light reflex, Right TM intact without perforation and Left TM intact without perforation.  Nose/Throat: External nose within normal limits, nasal crease present and septum midline. Turbinates edematous and pale with clear discharge. Posterior oropharynx erythematous with cobblestoning in the posterior oropharynx. Tonsils 2+ without exudates.  Tongue without thrush. Neck: Supple without thyromegaly. Trachea midline. Adenopathy: no enlarged lymph nodes appreciated in the anterior cervical, occipital, axillary, epitrochlear, inguinal, or popliteal regions. Lungs: Clear to auscultation without wheezing, rhonchi or rales. No increased work of breathing. CV: Normal S1/S2. No murmurs. Capillary refill <2 seconds.  Abdomen: Nondistended, nontender. No guarding or rebound tenderness. Bowel sounds present in all fields and hyperactive  Skin: Warm and dry, without lesions or rashes. Extremities:  No clubbing, cyanosis or edema. Neuro:   Grossly intact. No focal deficits appreciated. Responsive to questions.  Diagnostic studies:   Spirometry:    There was improvement in the FEV1 and FVC,  although it did not meet significance standards per ATS criteria.  Allergy Studies:   Indoor/Outdoor Percutaneous Adult Environmental Panel: positive to [place] pig, [place] blue grass, perennial rye grass, timothy grass, rough pigweed and mouse. Otherwise negative with adequate controls.  Indoor/Outdoor Selected Intradermal Environmental Panel: positive to mold mix #1, mold mix #3 and mite mix. Otherwise negative with adequate controls.   Allergy testing results were read and interpreted by myself, documented by clinical staff.       [name], MD [facility]

## 2018-04-28 NOTE — Patient Instructions (Signed)
1. Moderate persistent asthma, uncomplicated - Lung testing showed some mild restriction, and there was improvement with the nebulizer treatment (this was not significant per ATS criteria, however). - I think a trial of an ICS/LABA will be helpful to further diagnosis this: Symbicort 160/4.5 - Spacer sample and demonstration provided. - Daily controller medication(s): Singulair  daily and Symbicort 160/4.69mcg two puffs twice daily with spacer - Prior to physical activity: ProAir 2 puffs 10-15 minutes before physical activity. - Rescue medications: ProAir 4 puffs every 4-6 hours as needed - Asthma control goals:  * Full participation in all desired activities (may need albuterol before activity) * Albuterol use two time or less a week on average (not counting use with activity) * Cough interfering with sleep two time or less a month * Oral steroids no more than once a year * No hospitalizations  2. Seasonal and perennial allergic rhinitis - Testing today showed: mouse, [place] pig, weeds, grasses, outdoor molds and dust mites  - Make someone else change the [place] pig litter.  - Avoidance measures provided. - Continue with: Allegra (fexofenadine)  table once daily - Start taking: Singulair (montelukast)  daily and Nasacort (triamcinolone) two sprays per nostril daily - You can use an extra dose of the antihistamine, if needed, for breakthrough symptoms.  - Consider nasal saline rinses 1-2 times daily to remove allergens from the nasal cavities as well as help with mucous clearance (this is especially helpful to do before the nasal sprays are given) - Consider allergy shots as a means of long-term control. - Allergy shots "re-train" and "reset" the immune system to ignore environmental allergens and decrease the resulting immune response to those allergens (sneezing, itchy watery eyes, runny nose, nasal congestion, etc).     3. Return in about 4 weeks (around 05/25/2018) for for  office visit (11:15am).   Please inform us of any Emergency Department visits, hospitalizations, or changes in symptoms. Call us before going to the ED for breathing or allergy symptoms since we might be able to fit you in for a sick visit. Feel free to contact us anytime with any questions, problems, or concerns.  It was a pleasure to meet you today!  Websites that have reliable patient information: 1. American Academy of Asthma, Allergy, and Immunology: www.aaaai.org 2. Food Allergy Research and Education (FARE): foodallergy.org 3. Mothers of Asthmatics: http://www.asthmacommunitynetwork.org 4. American College of Allergy, Asthma, and Immunology: www.acaai.org   Reducing Pollen Exposure  The American Academy of Allergy, Asthma and Immunology suggests the following steps to reduce your exposure to pollen during allergy seasons.    1. Do not hang sheets or clothing out to dry; pollen may collect on these items. 2. Do not mow lawns or spend time around freshly cut grass; mowing stirs up pollen. 3. Keep windows closed at night.  Keep car windows closed while driving. 4. Minimize morning activities outdoors, a time when pollen counts are usually at their highest. 5. Stay indoors as much as possible when pollen counts or humidity is high and on windy days when pollen tends to remain in the air longer. 6. Use air conditioning when possible.  Many air conditioners have filters that trap the pollen spores. 7. Use a HEPA room air filter to remove pollen form the indoor air you breathe.  Control of Mold Allergen   Mold and fungi can grow on a variety of surfaces provided certain temperature and moisture conditions exist.  Outdoor molds grow on plants, decaying vegetation and soil.  The major outdoor mold, Alternaria and Cladosporium, are found in very high numbers during hot and dry conditions.  Generally, a late Summer - Fall peak is seen for common outdoor fungal spores.  Rain will temporarily  lower outdoor mold spore count, but counts rise rapidly when the rainy period ends.  The most important indoor molds are Aspergillus and Penicillium.  Dark, humid and poorly ventilated basements are ideal sites for mold growth.  The next most common sites of mold growth are the bathroom and the kitchen.  Outdoor (Seasonal) Mold Control  Positive outdoor molds via skin testing: Alternaria, Cladosporium, Bipolaris (Helminthsporium), Drechslera (Curvalaria) and Mucor  1. Use air conditioning and keep windows closed 2. Avoid exposure to decaying vegetation. 3. Avoid leaf raking. 4. Avoid grain handling. 5. Consider wearing a face mask if working in moldy areas.   Control of House Dust Mite Allergen    House dust mites play a major role in allergic asthma and rhinitis.  They occur in environments with high humidity wherever human skin, the food for dust mites is found. High levels have been detected in dust obtained from mattresses, pillows, carpets, upholstered furniture, bed covers, clothes and soft toys.  The principal allergen of the house dust mite is found in its feces.  A gram of dust may contain 1,000 mites and 250,000 fecal particles.  Mite antigen is easily measured in the air during house cleaning activities.    1. Encase mattresses, including the box spring, and pillow, in an air tight cover.  Seal the zipper end of the encased mattresses with wide adhesive tape. 2. Wash the bedding in water of 130 degrees Farenheit weekly.  Avoid cotton comforters/quilts and flannel bedding: the most ideal bed covering is the dacron comforter. 3. Remove all upholstered furniture from the bedroom. 4. Remove carpets, carpet padding, rugs, and non-washable window drapes from the bedroom.  Wash drapes weekly or use plastic window coverings. 5. Remove all non-washable stuffed toys from the bedroom.  Wash stuffed toys weekly. 6. Have the room cleaned frequently with a vacuum cleaner and a damp dust-mop.   The patient should not be in a room which is being cleaned and should wait 1 hour after cleaning before going into the room. 7. Close and seal all heating outlets in the bedroom.  Otherwise, the room will become filled with dust-laden air.  An electric heater can be used to heat the room. 8. Reduce indoor humidity to less than 50%.  Do not use a humidifier.

## 2018-04-29 MED FILL — SYMBICORT 160-4.5 MCG INH: 160-4.5 | 30 days supply | Qty: 10 | Fill #0

## 2018-04-29 MED FILL — MONTELUKAST SOD 10 MG TAB: 10 | 30 days supply | Qty: 30 | Fill #0

## 2018-04-30 ENCOUNTER — Ambulatory Visit (INDEPENDENT_AMBULATORY_CARE_PROVIDER_SITE_OTHER): Payer: 59 | Admitting: Internal Medicine

## 2018-04-30 ENCOUNTER — Encounter: Payer: Self-pay | Admitting: Allergy & Immunology

## 2018-04-30 ENCOUNTER — Encounter: Payer: Self-pay | Admitting: Internal Medicine

## 2018-04-30 VITALS — BP 116/70 | HR 89 | Ht 65.0 in | Wt 176.0 lb

## 2018-04-30 DIAGNOSIS — R05 Cough: Secondary | ICD-10-CM

## 2018-04-30 DIAGNOSIS — R058 Other specified cough: Secondary | ICD-10-CM

## 2018-04-30 MED ORDER — TRAMADOL HCL 50 MG PO TABS
50.0000 mg | ORAL_TABLET | ORAL | 0 refills | Status: AC | PRN
Start: 2018-04-30 — End: 2018-05-05

## 2018-04-30 MED ORDER — PANTOPRAZOLE SODIUM 40 MG PO TBEC
DELAYED_RELEASE_TABLET | ORAL | 2 refills | Status: DC
Start: 2018-04-30 — End: 2018-07-02

## 2018-04-30 MED ORDER — PREDNISONE 10 MG PO TABS: ORAL_TABLET | ORAL | 0 refills | Status: DC

## 2018-04-30 MED ORDER — FAMOTIDINE 20 MG PO TABS: ORAL_TABLET | ORAL | 11 refills | Status: DC

## 2018-04-30 MED FILL — PANTOPRAZOLE SOD DR 40 MG T: 40 | 30 days supply | Qty: 60 | Fill #0

## 2018-04-30 MED FILL — predniSONE 10 MG TABS: 10 | 6 days supply | Qty: 14 | Fill #0

## 2018-04-30 MED FILL — FAMOTIDINE 20 MG TABLET: 20 | 30 days supply | Qty: 30 | Fill #0

## 2018-04-30 MED FILL — traMADol HCL 50 MG TABS: 50 | 7 days supply | Qty: 40 | Fill #0

## 2018-04-30 NOTE — Patient Instructions (Addendum)
The key to effective treatment for your cough is eliminating the non-stop cycle of cough you're stuck in long enough to let your airway heal completely and then see if there is anything still making you cough once you stop the cough suppression, but this should take no more than 5 days to figure out  First take delsym two tsp every 12 hours and supplement if needed with  tramadol 50 mg up to 2 every 4 hours to suppress the urge to cough at all or even clear your throat. Swallowing water or using ice chips/non mint and menthol containing candies (such as lifesavers or sugarless jolly ranchers) are also effective.  You should rest your voice and avoid activities that you know make you cough.  Once you have eliminated the cough for 3 straight days try reducing the tramadol first,  then the delsym as tolerated.    Prednisone 10 mg take  4 each am x 2 days,   2 each am x 2 days,  1 each am x 2 days and stop (this is to eliminate allergies and inflammation from coughing)  Protonix (pantoprazole) Take 30-60 min before first and last meal of the day and Pepcid 20 mg one bedtime plus Chlorpheniramine 4 mg x 2 at bedtime (both available over the counter)  until cough is completely gone for at least a week without the need for cough suppression  GERD (REFLUX)  is an extremely common cause of respiratory symptoms, many times with no significant heartburn at all.    It can be treated with medication, but also with lifestyle changes including avoidance of late meals, excessive alcohol, smoking cessation, and avoid fatty foods, chocolate, peppermint, colas, red wine, and acidic juices such as orange juice.  NO MINT OR MENTHOL PRODUCTS SO NO COUGH DROPS  USE HARD CANDY INSTEAD (jolley ranchers or Stover's or Lifesavers (all available in sugarless versions) NO OIL BASED VITAMINS - use powdered substitutes.  If not completely better after you take this regimen call for follow up

## 2018-04-30 NOTE — Progress Notes (Signed)
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 Better and  04/28/18 eval by [name] 04/28/18  and allergy to grass/mold/guinea pig/dust weedd rec symb/saba no better yet (Dr [name] got the idea she was bette p saba but she denies) and rx also with singulair which did not start  so self referred to pulmonary clinic 04/30/2018     04/30/2018 1st [facility] office visit/ [place]   Chief Complaint  Patient presents with  . Pulmonary Consult    Self referred- c/o cough since late Dec 2018. She states that cough is mainly non prod and tends to get worse at night.   cough 24/7 gets worse p supper and peak right when head head hits pillow  Voice use aggravates  Off allegra nose got stuffy / runs at night > all better when back on allegra  On same BCP x 6 years  Both ribs ant hurt from coughing  Not limited by breathing from desired activities     No obvious day to day or daytime variability or assoc excess/ purulent sputum or mucus plugs or hemoptysis or cp or chest tightness, subjective wheeze or overt sinus or hb symptoms. No unusual exposure hx or h/o childhood pna/ asthma or knowledge of premature birth.  Also denies any obvious fluctuation of symptoms with weather or environmental changes or other aggravating or alleviating factors except as outlined above    Current Allergies, Complete Past Medical History, Past Surgical History, Family History, and Social History were reviewed in [employer] record.  ROS  The following are not active complaints unless bolded Hoarseness, sore throat, dysphagia, dental problems, itching, sneezing,  nasal congestion or discharge of excess mucus or purulent secretions, ear ache,   fever, chills, sweats, unintended wt loss or wt gain, classically pleuritic or exertional cp,  orthopnea pnd or arm/hand swelling  or leg swelling, presyncope, palpitations, abdominal pain, anorexia, nausea, vomiting, diarrhea  or change in bowel habits or change in bladder habits, change in stools or change in urine, dysuria, hematuria,  rash, arthralgias, visual complaints, headache, numbness, weakness or ataxia or problems with walking or coordination,  change in mood or  memory.        Current Meds - not able to confirm this list is correct  Medication Sig  . albuterol (PROAIR HFA) 108 (90 Base) MCG/ACT inhaler Inhale 2 puffs into the lungs every 4 (four) hours as needed for wheezing or shortness of breath.  . budesonide-formoterol (SYMBICORT) 160-4.5 MCG/ACT inhaler Inhale 2 puffs into the lungs 2 (two) times daily.  . fexofenadine (ALLEGRA) 180 MG tablet Take 180 mg by mouth daily.  . norethindrone-ethinyl estradiol-iron (BLISOVI FE 1.5/30) 1.5-30 MG-MCG tablet Take 1 tablet by mouth daily.  Marland Kitchen triamcinolone (NASACORT) 55 MCG/ACT AERO nasal inhaler Place 2 sprays  into the nose daily.        Review of Systems     Objective:   Physical Exam    amb pleasant wf nad with classic pseudowheeze   Wt Readings from Last 3 Encounters:  04/30/18 176 lb (79.8 kg)  12/16/17 165 lb 3.2 oz (74.9 kg)  11/30/17 163 lb (73.9 kg)     Vital signs reviewed - Note on arrival 02 sats  100% on RA     HEENT: nl dentition, turbinates bilaterally, and oropharynx. Nl external ear canals without cough reflex   NECK :   without JVD/Nodes/TM/ nl carotid upstrokes bilaterally   LUNGS: no acc muscle use,  Nl contour chest which is clear to A and P bilaterally without cough on insp or exp maneuvers   CV:  RRR  no s3 or murmur or increase in P2, and no edema   ABD:  soft and nontender with nl inspiratory excursion in the supine position. No bruits or organomegaly appreciated, bowel sounds nl  MS:  Nl gait/ ext warm without deformities, calf tenderness, cyanosis or clubbing No obvious joint restrictions   SKIN: warm and dry without lesions    NEURO:  alert, approp, nl sensorium with  no motor or cerebellar deficits apparent.        I personally reviewed images and agree with radiology impression as follows:  CXR:   03/15/18 Normal exam.     Assessment:

## 2018-05-01 ENCOUNTER — Encounter: Payer: Self-pay | Admitting: Internal Medicine

## 2018-05-01 DIAGNOSIS — R058 Other specified cough: Secondary | ICD-10-CM | POA: Insufficient documentation

## 2018-05-01 DIAGNOSIS — R05 Cough: Secondary | ICD-10-CM | POA: Insufficient documentation

## 2018-05-01 NOTE — Assessment & Plan Note (Addendum)
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   These are the same pts (now being labeled as having "irritable larynx syndrome" by some cough centers) who not infrequently have a history of having failed to tolerate ace inhibitors,  dry powder inhalers or biphosphonates or report having atypical/extraesophageal reflux symptoms that don't respond to standard doses of PPI  and are easily confused as having aecopd or asthma flares by even experienced allergists/ pulmonologists (myself included).   Of the three most common causes of  Sub-acute / recurrent or chronic cough, only one (GERD)  can actually contribute to/ trigger  the other two (asthma and post nasal drip syndrome)  and perpetuate the cylce of cough.  While not intuitively obvious, many patients with chronic low grade reflux (which could be from chronic use of progesterone in this case) do not cough until there is a primary insult that disturbs the protective epithelial barrier and exposes sensitive nerve endings.   This is typically viral but can due to PNDS from allergic rhinitis as per Dr Ellouise Newer w/u  and  either may  Have applied here.   The point is that once this occurs, it is difficult to eliminate the cycle  using anything but a maximally effective acid suppression regimen at least in the short run, accompanied by an appropriate diet to address non acid GERD and control /  eliminate the cough itself for at least 3 days with tramadol and 1st gen H1 blockers per guidelines  To eliminate all pnds.   If not better return with all meds in hand using a trust but verify approach to confirm accurate Medication  Reconciliation The principal here is that until we are certain that the  patients are doing what we've asked, it makes no sense to ask them to do more.    Total time devoted to counseling  > 50 % of initial 60 min office visit:  review case with pt/husband Sara Hamilton and discussion of options/alternatives/ personally creating written customized instructions  in presence of pt  then going over those specific  Instructions directly with the pt including how to use all of the meds but in particular covering each new medication in detail and the difference between the maintenance= "automatic" meds and the prns using an action plan format for the latter (If this problem/symptom => do that organization reading Left to right).  Please see AVS from this visit for a full list of these instructions which I personally wrote for this pt and  are unique to this visit.

## 2018-05-12 ENCOUNTER — Telehealth: Payer: Self-pay | Admitting: Internal Medicine

## 2018-05-12 MED ORDER — PREDNISONE 10 MG PO TABS: ORAL_TABLET | ORAL | 0 refills | Status: DC

## 2018-05-12 NOTE — Telephone Encounter (Signed)
Spoke with pt, she is still coughing (dry cough) after the treatment MW recommended on 04/30/2018. She is still taking pepcid, protonix, and chlor tabs and seemed better but the cough has started again and worse at night. She would like to know if she can do anything else for the cough? Please advise.  Please call [phone] pt cell phone   [facility]    Patient Instructions by Nyoka Cowden, MD at 04/30/2018 10:15 AM  Author: Nyoka Cowden, MD Author Type: Physician Filed: 04/30/2018 10:45 AM  Note Status: Addendum Cosign: Cosign Not Required Encounter Date: 04/30/2018  Editor: Nyoka Cowden, MD (Physician)  Prior Versions: 1. Nyoka Cowden, MD (Physician) at 04/30/2018 10:42 AM - Signed    The key to effective treatment for your cough is eliminating the non-stop cycle of cough you're stuck in long enough to let your airway heal completely and then see if there is anything still making you cough once you stop the cough suppression, but this should take no more than 5 days to figure out  First take delsym two tsp every 12 hours and supplement if needed with  tramadol 50 mg up to 2 every 4 hours to suppress the urge to cough at all or even clear your throat. Swallowing water or using ice chips/non mint and menthol containing candies (such as lifesavers or sugarless jolly ranchers) are also effective.  You should rest your voice and avoid activities that you know make you cough.  Once you have eliminated the cough for 3 straight days try reducing the tramadol first,  then the delsym as tolerated.    Prednisone 10 mg take  4 each am x 2 days,   2 each am x 2 days,  1 each am x 2 days and stop (this is to eliminate allergies and inflammation from coughing)  Protonix (pantoprazole) Take 30-60 min before first and last meal of the day and Pepcid 20 mg one bedtime plus Chlorpheniramine 4 mg x 2 at bedtime (both available over the counter)  until cough is completely gone for at  least a week without the need for cough suppression  GERD (REFLUX)  is an extremely common cause of respiratory symptoms, many times with no significant heartburn at all.    It can be treated with medication, but also with lifestyle changes including avoidance of late meals, excessive alcohol, smoking cessation, and avoid fatty foods, chocolate, peppermint, colas, red wine, and acidic juices such as orange juice.  NO MINT OR MENTHOL PRODUCTS SO NO COUGH DROPS  USE HARD CANDY INSTEAD (jolley ranchers or Stover's or Lifesavers (all available in sugarless versions) NO OIL BASED VITAMINS - use powdered substitutes.  If not completely better after you take this regimen call for follow up

## 2018-05-12 NOTE — Telephone Encounter (Signed)
Spoke with patient. She stated that she is taking the Singulair at night. She is ok with trying the prednisone again and call next week if she is not any better.   Prednisone will be sent to pharmacy. Nothing else needed at time of call.

## 2018-05-12 NOTE — Telephone Encounter (Signed)
Left message for patient to call back  

## 2018-05-12 NOTE — Telephone Encounter (Signed)
Make sure she starts singulair daily if hasn't already and go ahead and give one more cycle prednisone = Prednisone 10 mg take  4 each am x 2 days,   2 each am x 2 days,  1 each am x 2 days and stop   Then ov with me by end of next week but must bring all active meds with her

## 2018-05-12 NOTE — Telephone Encounter (Signed)
Patient returned call, CB is [phone]

## 2018-05-25 ENCOUNTER — Ambulatory Visit: Payer: 59 | Admitting: Allergy and Immunology

## 2018-05-26 MED FILL — LARIN FE 1.5-30 TABLET: 1.5-30 | 84 days supply | Qty: 84 | Fill #2

## 2018-05-26 MED FILL — MONTELUKAST SOD 10 MG TAB: 10 | 30 days supply | Qty: 30 | Fill #1

## 2018-06-28 MED FILL — MONTELUKAST SOD 10 MG TAB: 10 | 30 days supply | Qty: 30 | Fill #2

## 2018-06-28 MED FILL — PROAIR HFA 90 MCG INHALER: 108 (90 BAS | 17 days supply | Qty: 9 | Fill #0

## 2018-07-02 ENCOUNTER — Ambulatory Visit: Payer: 59 | Admitting: Primary Care

## 2018-07-02 ENCOUNTER — Encounter: Payer: Self-pay | Admitting: Primary Care

## 2018-07-02 VITALS — BP 110/70 | HR 75 | Ht 65.0 in | Wt 179.2 lb

## 2018-07-02 DIAGNOSIS — R05 Cough: Secondary | ICD-10-CM

## 2018-07-02 DIAGNOSIS — J4540 Moderate persistent asthma, uncomplicated: Secondary | ICD-10-CM

## 2018-07-02 DIAGNOSIS — R058 Other specified cough: Secondary | ICD-10-CM

## 2018-07-02 LAB — POCT EXHALED NITRIC OXIDE: FENO LEVEL (PPB): 97

## 2018-07-02 NOTE — Progress Notes (Signed)
@Patient  ID: Sara Hamilton, female    DOB: 1986/07/16, 32 y.o.   MRN: [id number]  Chief Complaint  Patient presents with  . Follow-up    States her cough did improve while on prednisone but once finished it returned. States when she laughs or takes a deep breath it triggers her cough. Coughing has her rib area sore. Allergist gave her symbicort that she has been using for 3 days.     Referring provider: No ref. provider found  HPI: 32 year old female, never smoked.  History of seasonal allergies.  Patient of [name], last seen on 04/30/18 for upper airway cough.  Patient was started on Delsym, tramadol, prednisone, Protonix and Pepcid.  States that these helped for a while but cough returned.  She required an additional prednisone course.  She had seen allergy prior to pulmonary consult on 04/28/18 and given Symbicort but did not start taking it right away.  Testing Spirometry 5/15- mild restriction  CXR 4/1- clear lungs   07/02/2018 Presents today for follow-up appointment regarding upper airway cough. Patient states that she coughs when she talks or laughs. Patient started using Symbicort 3 days ago and has noticed some minor improvements.  No associated wheezing or shortness of breath.  She does have a dog at home.   No Known Allergies  Immunization History  Administered Date(s) Administered  . Influenza-Unspecified 09/04/2016  . Tdap 07/04/2011    Past Medical History:  Diagnosis Date  . Abnormal Pap smear   . Contraceptive management 11/07/2013  . No pertinent past medical history   . Vaginal Pap smear, abnormal     Tobacco History: Social History   Tobacco Use  Smoking Status Never Smoker  Smokeless Tobacco Never Used   Counseling given: Not Answered   Outpatient Medications Prior to Visit  Medication Sig Dispense Refill  . albuterol (PROAIR HFA) 108 (90 Base) MCG/ACT inhaler Inhale 2 puffs into the lungs every 4 (four) hours as needed for wheezing or  shortness of breath. 1 Inhaler 1  . fexofenadine (ALLEGRA) 180 MG tablet Take 180 mg by mouth daily.    . montelukast (SINGULAIR) 10 MG tablet Take 1 tablet (10 mg total) by mouth at bedtime. 30 tablet 5  . norethindrone-ethinyl estradiol-iron (BLISOVI FE 1.5/30) 1.5-30 MG-MCG tablet Take 1 tablet by mouth daily. 84 tablet 4  . triamcinolone (NASACORT) 55 MCG/ACT AERO nasal inhaler Place 2 sprays into the nose daily. 1 Inhaler 5  . famotidine (PEPCID) 20 MG tablet One at bedtime (Patient not taking: Reported on 07/02/2018) 30 tablet 11  . pantoprazole (PROTONIX) 40 MG tablet Take 30- 60 min before your first and last meals of the day (Patient not taking: Reported on 07/02/2018) 60 tablet 2  . predniSONE (DELTASONE) 10 MG tablet Take  4 each am x 2 days,   2 each am x 2 days,  1 each am x 2 days and stop (Patient not taking: Reported on 07/02/2018) 14 tablet 0  . predniSONE (DELTASONE) 10 MG tablet Take 4 tabs for 2 days, then 3 tabs for 2 days, 2 tabs for 2 days, then 1 tab for 2 days, then stop. (Patient not taking: Reported on 07/02/2018) 20 tablet 0   No facility-administered medications prior to visit.       Review of Systems  Review of Systems  Constitutional: Negative.   HENT: Negative.   Respiratory: Positive for cough. Negative for shortness of breath and wheezing.   Cardiovascular: Negative.   Skin: Negative.  Allergic/Immunologic: Positive for environmental allergies.     Physical Exam  BP 110/70   Pulse 75   Ht 5\' 5"  (1.651 m)   Wt 179 lb 3.2 oz (81.3 kg)   SpO2 99%   BMI 29.82 kg/m  Physical Exam  Constitutional: She is oriented to person, place, and time. She appears well-developed and well-nourished.  HENT:  Head: Normocephalic and atraumatic.  Eyes: Pupils are equal, round, and reactive to light. EOM are normal.  Neck: Normal range of motion. Neck supple.  Cardiovascular: Normal rate, regular rhythm and normal heart sounds.  No murmur heard. Pulmonary/Chest:  Effort normal and breath sounds normal. No respiratory distress. She has no wheezes.  Abdominal: Soft. Bowel sounds are normal. There is no tenderness.  Neurological: She is alert and oriented to person, place, and time.  Skin: Skin is warm and dry. No rash noted. No erythema.  Psychiatric: She has a normal mood and affect. Her behavior is normal. Judgment normal.     Lab Results:  CBC    Component Value Date/Time   WBC 14.8 (H) 07/03/2011 0520   RBC 3.07 (L) 07/03/2011 0520   HGB 10.3 (L) 07/03/2011 0520   HCT 30.1 (L) 07/03/2011 0520   PLT 230 07/03/2011 0520   MCV 98.0 07/03/2011 0520   MCH 33.6 07/03/2011 0520   MCHC 34.2 07/03/2011 0520   RDW 12.3 07/03/2011 0520    BMET No results found for: NA, K, CL, CO2, GLUCOSE, BUN, CREATININE, CALCIUM, GFRNONAA, GFRAA  BNP No results found for: BNP  ProBNP No results found for: PROBNP  Imaging: No results found.   Assessment & Plan:   Moderate persistent asthma, uncomplicated - Spirometry showed mild restriction, FENO 97 - Started on Symbicort 2puffs BID - Continues Singulair, allegra and nasacort - FU in 6-8 weeks if no improvement, otherwise, return in 1 year   Upper airway cough syndrome - Cough likely exacerbated by asthma component  - UACS medication trial helped but cough shortly returned - Required additional prednisone course, steroid seemed to be the only thing that helped - Continues Allegra, Nasacort and recently added Singulair  - Delsym and tessalon perles as needed for cough      Glenford BayleyElizabeth W Vaishnav Demartin, NP 07/02/2018

## 2018-07-02 NOTE — Patient Instructions (Signed)
Continue Symbicort 2 puffs twice daily- daily asthma maintenance  Continue Allegra, Singulair and nasacort - daily for allergy maintenance   Delsym or tessalon perle- for cough  Return if not better in 6-8 weeks, otherwise please FU in 1 year with Dr. Sherene SiresWert or Waynetta SandyBeth

## 2018-07-02 NOTE — Assessment & Plan Note (Addendum)
-   Cough likely exacerbated by asthma component  - UACS medication trial helped but cough shortly returned - Required additional prednisone course, steroid seemed to be the only thing that helped - Continues Allegra, Nasacort and recently added Singulair  - Delsym and tessalon perles as needed for cough

## 2018-07-02 NOTE — Assessment & Plan Note (Addendum)
-   Spirometry showed mild restriction, FENO 97 - Started on Symbicort 2puffs BID - Continues Singulair, allegra and nasacort - FU in 6-8 weeks if no improvement, otherwise, return in 1 year

## 2018-07-03 NOTE — Progress Notes (Signed)
Chart and office note reviewed in detail  > agree with a/p as outlined    

## 2018-07-20 MED FILL — PROAIR HFA 90 MCG INHALER: 108 (90 BAS | 17 days supply | Qty: 9 | Fill #1

## 2018-07-22 MED FILL — MONTELUKAST SOD 10 MG TAB: 10 | 30 days supply | Qty: 30 | Fill #3

## 2018-08-10 MED FILL — LARIN FE 1.5-30 TABLET: 1.5-30 | 84 days supply | Qty: 84 | Fill #3

## 2018-08-10 MED FILL — SYMBICORT 160-4.5 MCG INH: 160-4.5 | 30 days supply | Qty: 10 | Fill #1

## 2018-08-11 ENCOUNTER — Ambulatory Visit: Payer: 59 | Admitting: Primary Care

## 2018-09-01 MED FILL — MONTELUKAST SOD 10 MG TAB: 10 | 30 days supply | Qty: 30 | Fill #4

## 2018-09-29 MED FILL — MONTELUKAST SOD 10 MG TAB: 10 | 30 days supply | Qty: 30 | Fill #5

## 2018-11-02 ENCOUNTER — Other Ambulatory Visit: Payer: Self-pay | Admitting: Allergy & Immunology

## 2018-11-02 MED FILL — MONTELUKAST SOD 10 MG TAB: 10 | 30 days supply | Qty: 30 | Fill #0

## 2018-11-02 MED FILL — SYMBICORT 160-4.5 MCG INH: 160-4.5 | 30 days supply | Qty: 10 | Fill #3

## 2018-11-04 MED FILL — LARIN FE 1.5-30 TABLET: 1.5-30 | 84 days supply | Qty: 84 | Fill #4

## 2018-12-01 ENCOUNTER — Other Ambulatory Visit: Payer: Self-pay | Admitting: Allergy & Immunology

## 2018-12-01 MED FILL — AMOX-CLAV 875-125 MG TABLET: 875-125 | 7 days supply | Qty: 14 | Fill #0

## 2018-12-01 MED FILL — SYMBICORT 160-4.5 MCG INH: 160-4.5 | 30 days supply | Qty: 10 | Fill #4

## 2018-12-01 NOTE — Telephone Encounter (Signed)
RF for montelukast denied at [facility], pt needs an OV. Courtesy refill already given.

## 2018-12-09 MED ORDER — MONTELUKAST SODIUM 10 MG PO TABS: 10.0000 mg | ORAL_TABLET | Freq: Every day | ORAL | 4 refills | Status: DC

## 2018-12-09 MED FILL — MONTELUKAST SOD 10 MG TAB: 10 | 30 days supply | Qty: 30 | Fill #0

## 2018-12-09 MED FILL — AZITHROMYCIN 250 MG TABLET: 250 | 5 days supply | Qty: 6 | Fill #0

## 2018-12-09 MED FILL — FLUTICASONE PROP 50 MCG SPR: 50 | 30 days supply | Qty: 16 | Fill #0

## 2018-12-09 MED FILL — predniSONE 10 MG TABS: 10 | 6 days supply | Qty: 14 | Fill #0

## 2018-12-13 ENCOUNTER — Other Ambulatory Visit: Payer: 59 | Admitting: Adult Health

## 2018-12-30 ENCOUNTER — Other Ambulatory Visit (HOSPITAL_COMMUNITY)
Admission: RE | Admit: 2018-12-30 | Discharge: 2018-12-30 | Disposition: A | Discharge status: Home / Self Care | Payer: 59 | Source: Ambulatory Visit | Attending: Adult Health | Admitting: Adult Health

## 2018-12-30 ENCOUNTER — Encounter: Payer: Self-pay | Admitting: Adult Health

## 2018-12-30 ENCOUNTER — Ambulatory Visit (INDEPENDENT_AMBULATORY_CARE_PROVIDER_SITE_OTHER): Payer: 59 | Admitting: Adult Health

## 2018-12-30 VITALS — BP 112/70 | HR 78 | Ht 65.5 in | Wt 181.5 lb

## 2018-12-30 DIAGNOSIS — Z01419 Encounter for gynecological examination (general) (routine) without abnormal findings: Secondary | ICD-10-CM | POA: Insufficient documentation

## 2018-12-30 DIAGNOSIS — Z3041 Encounter for surveillance of contraceptive pills: Secondary | ICD-10-CM

## 2018-12-30 MED ORDER — NORETHIN ACE-ETH ESTRAD-FE 1.5-30 MG-MCG PO TABS: 1.0000 | ORAL_TABLET | Freq: Every day | ORAL | 4 refills | Status: DC

## 2018-12-30 NOTE — Progress Notes (Signed)
Patient ID: Sara Hamilton, female   DOB: 04-Jan-1986, 33 y.o.   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 PCP is [place].    Current Medications, Allergies, Past Medical History, Past Surgical History, Family History and Social History were reviewed in [employer] record.     Review of Systems: Patient denies any headaches, hearing loss, fatigue, blurred vision, shortness of breath, chest pain, abdominal pain, problems with bowel movements, urination, or intercourse. No joint pain or mood swings. Happy with her OCs.    Physical Exam:BP 112/70 (BP Location: Left Arm, Patient Position: Sitting, Cuff Size: Normal)   Pulse 78   Ht 5' 5.5" (1.664 m)   Wt 181 lb 8 oz (82.3 kg)   LMP 12/12/2018   BMI 29.74 kg/m   General:  Well developed, well nourished, no acute distress Skin:  Warm and dry Neck:  Midline trachea, normal thyroid, good ROM, no lymphadenopathy Lungs; Clear to auscultation bilaterally Breast:  No dominant palpable mass, retraction, or nipple discharge Cardiovascular: Regular rate and rhythm Abdomen:  Soft, non tender, no hepatosplenomegaly Pelvic:  External genitalia is normal in appearance, no lesions.  The vagina is normal in appearance. Urethra has no lesions or masses. The cervix is bulbous.  Uterus is felt to be normal size, shape, and contour.  No adnexal masses or tenderness noted.Bladder is non tender, no masses felt. Extremities/musculoskeletal:  No swelling or varicosities noted, no clubbing or cyanosis Psych:  No mood changes, alert and cooperative,seems happy PHQ 2 score 0. Fall risk is low.Examination chaperoned by Malachy Mood LPN.  Impression: 1. Encounter for gynecological examination with Papanicolaou smear of cervix   2. Encounter for surveillance of contraceptive pills       Plan: Meds ordered this encounter  Medications   . norethindrone-ethinyl estradiol-iron (BLISOVI FE 1.5/30) 1.5-30 MG-MCG tablet    Sig: Take 1 tablet by mouth daily.    Dispense:  84 tablet    Refill:  4    Order Specific Question:   Supervising Provider    Answer:   Lazaro Arms [[id number]]  Physical in 1 year Pap in 3 if normal

## 2019-01-05 LAB — CYTOLOGY - PAP
DIAGNOSIS: NEGATIVE
HPV (WINDOPATH): NOT DETECTED

## 2019-01-12 MED FILL — MONTELUKAST SOD 10 MG TAB: 10 | 30 days supply | Qty: 30 | Fill #1

## 2019-01-31 MED FILL — LARIN FE 1.5-30 TABLET: 1.5-30 | 84 days supply | Qty: 84 | Fill #0

## 2019-02-08 MED FILL — MONTELUKAST SOD 10 MG TAB: 10 | 90 days supply | Qty: 90 | Fill #2 | Status: TO

## 2019-03-04 MED FILL — SYMBICORT 160-4.5 MCG INH: 160-4.5 | 30 days supply | Qty: 10 | Fill #5

## 2019-04-20 ENCOUNTER — Other Ambulatory Visit: Payer: Self-pay | Admitting: Internal Medicine

## 2019-04-20 MED FILL — MONTELUKAST SOD 10 MG TAB: 10 | 30 days supply | Qty: 30 | Fill #0

## 2019-04-20 MED FILL — LARIN FE 1.5-30 TABLET: 1.5-30 | 84 days supply | Qty: 84 | Fill #0

## 2019-04-21 ENCOUNTER — Other Ambulatory Visit: Payer: Self-pay | Admitting: Internal Medicine

## 2019-04-21 MED ORDER — SYMBICORT 160-4.5 MCG/ACT IN AERO: 2.0000 | INHALATION_SPRAY | Freq: Two times a day (BID) | RESPIRATORY_TRACT | 5 refills | Status: DC

## 2019-04-21 MED FILL — SYMBICORT 160-4.5 MCG INH: 160-4.5 | 30 days supply | Qty: 10 | Fill #0

## 2019-05-30 MED FILL — MONTELUKAST SOD 10 MG TAB: 10 | 30 days supply | Qty: 30 | Fill #0

## 2019-05-30 MED FILL — SYMBICORT 160-4.5 MCG INH: 160-4.5 | 30 days supply | Qty: 10 | Fill #0

## 2019-07-04 ENCOUNTER — Other Ambulatory Visit: Payer: Self-pay

## 2019-07-04 ENCOUNTER — Ambulatory Visit: Payer: 59 | Admitting: Internal Medicine

## 2019-07-04 ENCOUNTER — Encounter: Payer: Self-pay | Admitting: Internal Medicine

## 2019-07-04 DIAGNOSIS — J45991 Cough variant asthma: Secondary | ICD-10-CM | POA: Insufficient documentation

## 2019-07-04 MED ORDER — PREDNISONE 10 MG PO TABS: ORAL_TABLET | ORAL | 0 refills | Status: DC

## 2019-07-04 NOTE — Progress Notes (Signed)
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 Better and  04/28/18 eval by [name] 04/28/18  and allergy to grass/mold/guinea pig/dust weedd rec symb/saba no better yet (Dr [name] got the idea she was bette p saba but she denies) and rx also with singulair which did not start  so self referred to pulmonary clinic 04/30/2018     History of Present Illness  04/30/2018 1st [facility] office visit/ Sara Hamilton   Chief Complaint  Patient presents with  . Pulmonary Consult    Self referred- c/o cough since late Dec 2018. She states that cough is mainly non prod and tends to get worse at night.   cough 24/7 gets worse p supper and peak right when head head hits pillow  Voice use aggravates  Off allegra nose got stuffy / runs at night > all better when back on allegra  On same BCP x 6 years  Both ribs ant hurt from coughing  Not limited by breathing from desired activities rec  First take delsym two tsp every 12 hours and supplement if needed with  tramadol 50 mg up to 2 every 4 hours to suppress the urge to cough at all or even clear your throat. Swallowing water or using ice chips/non mint and menthol containing candies (such as lifesavers or sugarless jolly ranchers) are also effective.  You should rest your voice and avoid activities  that you know make you cough. Once you have eliminated the cough for 3 straight days try reducing the tramadol first,  then the delsym as tolerated.   Prednisone 10 mg take  4 each am x 2 days,   2 each am x 2 days,  1 each am x 2 days and stop (this is to eliminate allergies and inflammation from coughing) Protonix (pantoprazole) Take 30-60 min before first and last meal of the day and Pepcid 20 mg one bedtime plus Chlorpheniramine 4 mg x 2 at bedtime (both available over the counter)  until cough is completely gone for at least a week without the need for cough suppression GERD diet    7/19/200 NP Cough recurred p above rx so rec Continue Symbicort 2 puffs twice daily- daily asthma maintenance  Continue Allegra, Singulair and nasacort - daily for allergy maintenance   Delsym or tessalon perle- for cough   07/04/2019  f/u ov/Sara Hamilton 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 Follow-up    Cough has resolved. She has not had to use her proair.    Dyspnea:  Not limited by breathing from desired activities  But no aerobics / some laps in ppol  Cough: none Sleeping: fine  SABA use: none  02: none    No obvious  day to day or daytime variability or assoc excess/ purulent sputum or mucus plugs or hemoptysis or cp or chest tightness, subjective wheeze or overt sinus or hb symptoms.   Sleeping  without nocturnal  or early am exacerbation  of respiratory  c/o's or need for noct saba. Also denies any obvious fluctuation of symptoms with weather or environmental changes or other aggravating or alleviating factors except as outlined above   No unusual exposure hx or h/o childhood pna/ asthma or knowledge of premature birth.  Current Allergies, Complete Past Medical History, Past Surgical History, Family History, and Social History were reviewed in [facility] electronic medical record.  ROS  The following are not active complaints unless bolded Hoarseness,  sore throat, dysphagia, dental problems, itching, sneezing,  nasal congestion or discharge of excess mucus or purulent secretions, ear ache,   fever, chills, sweats, unintended wt loss or wt gain, classically pleuritic or exertional cp,  orthopnea pnd or arm/hand swelling  or leg swelling, presyncope, palpitations, abdominal pain, anorexia, nausea, vomiting, diarrhea  or change in bowel habits or change in bladder habits, change in stools or change in urine, dysuria, hematuria,  rash, arthralgias, visual complaints, headache, numbness, weakness or ataxia or problems with walking or coordination,  change in mood or  memory.        Current Meds  Medication Sig  . albuterol (PROAIR HFA) 108 (90 Base) MCG/ACT inhaler Inhale 2 puffs into the lungs every 4 (four) hours as needed for wheezing or shortness of breath.  . Cetirizine HCl (ZYRTEC PO) Take by mouth daily.  . fluticasone (FLONASE) 50 MCG/ACT nasal spray Place 2 sprays into both nostrils at bedtime.   . montelukast (SINGULAIR) 10 MG tablet TAKE 1 TABLET BY MOUTH AT BEDTIME.  . norethindrone-ethinyl estradiol-iron (BLISOVI FE 1.5/30) 1.5-30 MG-MCG tablet Take 1 tablet by mouth daily.  . SYMBICORT 160-4.5 MCG/ACT inhaler Inhale 2 puffs into the lungs 2 (two) times daily.               Objective:   Physical Exam      07/04/2019       183   04/30/18 176 lb (79.8 kg)  12/16/17 165 lb 3.2 oz (74.9 kg)  11/30/17 163 lb (73.9 kg)     Vital signs reviewed - Note on arrival 02 sats  100% on RA    HEENT: nl dentition, turbinates bilaterally, and oropharynx. Nl external ear canals without cough reflex   NECK :  without JVD/Nodes/TM/ nl carotid upstrokes bilaterally   LUNGS: no acc muscle use,  Nl contour chest which is clear to A and P bilaterally without cough on insp or exp maneuvers   CV:  RRR  no s3 or murmur or increase in P2, and no edema   ABD:  soft and nontender with nl inspiratory excursion in the supine position. No bruits or  organomegaly appreciated, bowel sounds nl  MS:  Nl gait/ ext warm without deformities, calf tenderness, cyanosis or clubbing No obvious joint restrictions   SKIN: warm and dry without lesions    NEURO:  alert, approp, nl sensorium with  no motor or cerebellar deficits apparent.          Assessment:

## 2019-07-04 NOTE — Patient Instructions (Addendum)
symbicort 160 Take 2 puffs first thing in am and then another 2 puffs about 12 hours later until 100% better for at least then ok to taper down and off  Continue singulair and zyrtec   If not doing better take the prednisone Prednisone 10 mg take  4 each am x 2 days,   2 each am x 2 days,  1 each am x 2 days and stop    If you are satisfied with your treatment plan,  let your doctor know and he/she can either refill your medications or you can return here when your prescription runs out.     If in any way you are not 100% satisfied,  please tell us.  If 100% better, tell your friends!  Pulmonary follow up is as needed

## 2019-07-04 NOTE — Assessment & Plan Note (Signed)
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 so rec d/c symbicort  And rx as cyclical cough  - 04/4007 flaired off symb 160 while on singulair so resumed symb 160 2bid - 07/04/2019  After extensive coaching inhaler device,  effectiveness =    90% so rec cont symb 160 2 bid prn    She is not convinced she really needs to use the symb 160 2bid so ok to try taper if all resp symptoms (including upper) are well controlled but if flares each time she tapers then clearly need the 160 and maybe not benefiting from the singulair  If even on the 160 2bid not improving then pred x 6 days then consider biologics per Allergy vs allergy shots    NB Based on two studies from NEJM  378; 20 p 1865 (2018) and 380 : p2020-30 (2019) in pts with mild asthma it is reasonable to use low dose symbicort eg 80 2bid (tho not with the 160) "prn" flare in this setting but I emphasized this was only shown with symbicort and takes advantage of the rapid onset of action but is not the same as "rescue therapy" but can be stopped once the acute symptoms have resolved and the need for rescue has been minimized (< 2 x weekly ie the rule of two's reviewed)   I had an extended discussion with the patient reviewing all relevant studies completed to date and  lasting 15 to 20 minutes of a 25 minute visit    I performed detailed device teaching using a teach back method which extended face to face time for this visit (see above)  Each maintenance medication was reviewed in detail including emphasizing most importantly the difference between maintenance and prns and under what circumstances the prns are to be triggered using an action plan format that is not reflected in the computer generated alphabetically organized AVS which I have not found useful in most complex patients, especially with  respiratory illnesses  Please see AVS for specific instructions unique to this visit that I personally wrote and verbalized to the the pt in detail and then reviewed with pt  by my nurse highlighting any  changes in therapy recommended at today's visit to their plan of care.

## 2019-07-11 MED FILL — MONTELUKAST SOD 10 MG TAB: 10 | 30 days supply | Qty: 30 | Fill #1

## 2019-07-21 ENCOUNTER — Telehealth: Payer: Self-pay | Admitting: Adult Health

## 2019-07-21 MED ORDER — DESOGESTREL-ETHINYL ESTRADIOL 0.15-30 MG-MCG PO TABS: 1.0000 | ORAL_TABLET | Freq: Every day | ORAL | 11 refills | Status: DC

## 2019-07-21 NOTE — Telephone Encounter (Signed)
Patient called stating that she would like for Sara Hamilton to give her a call, pt states that she was mycharting with her but is not sure if she was going to call her in a new BC pill or did she needs to make an appointment please contact pt

## 2019-07-21 NOTE — Addendum Note (Signed)
Addended by: Derrek Monaco A on: 07/21/2019 05:47 PM   Modules accepted: Orders

## 2019-07-21 NOTE — Telephone Encounter (Signed)
Pt having irregular bleeding , like every other week, not heavy, no pain. Finish current pack, then start new pill pack, and use condoms for 1 pack, rx apri

## 2019-07-22 MED FILL — JULEBER 0.15-30 MG-MCG TABS: 0.15-30 | 28 days supply | Qty: 28 | Fill #0

## 2019-07-26 MED FILL — SYMBICORT 160-4.5 MCG INH: 160-4.5 | 30 days supply | Qty: 10 | Fill #1

## 2019-08-11 MED FILL — MONTELUKAST SOD 10 MG TAB: 10 | 30 days supply | Qty: 30 | Fill #2

## 2019-08-24 MED FILL — JULEBER 0.15-30 MG-MCG TABS: 0.15-30 | 84 days supply | Qty: 84 | Fill #1

## 2019-08-24 MED FILL — SYMBICORT 160-4.5 MCG INH: 160-4.5 | 30 days supply | Qty: 10 | Fill #2

## 2019-09-07 MED FILL — MONTELUKAST SOD 10 MG TAB: 10 | 30 days supply | Qty: 30 | Fill #3

## 2019-10-13 ENCOUNTER — Other Ambulatory Visit: Payer: Self-pay | Admitting: Internal Medicine

## 2019-10-13 MED FILL — MONTELUKAST SOD 10 MG TAB: 10 | 30 days supply | Qty: 30 | Fill #0

## 2019-11-07 MED FILL — JULEBER 0.15-30 MG-MCG TABS: 0.15-30 | 84 days supply | Qty: 84 | Fill #2

## 2019-11-07 MED FILL — MONTELUKAST SOD 10 MG TAB: 10 | 30 days supply | Qty: 30 | Fill #1

## 2019-12-12 MED FILL — MONTELUKAST SOD 10 MG TAB: 10 | 30 days supply | Qty: 30 | Fill #2

## 2019-12-19 ENCOUNTER — Other Ambulatory Visit: Payer: 59

## 2019-12-20 ENCOUNTER — Telehealth (INDEPENDENT_AMBULATORY_CARE_PROVIDER_SITE_OTHER): Payer: 59 | Admitting: Internal Medicine

## 2019-12-20 ENCOUNTER — Encounter: Payer: Self-pay | Admitting: Internal Medicine

## 2019-12-20 ENCOUNTER — Telehealth: Payer: Self-pay | Admitting: Internal Medicine

## 2019-12-20 DIAGNOSIS — R058 Other specified cough: Secondary | ICD-10-CM

## 2019-12-20 DIAGNOSIS — R05 Cough: Secondary | ICD-10-CM

## 2019-12-20 MED ORDER — DEXAMETHASONE 6 MG PO TABS: ORAL_TABLET | ORAL | 0 refills | Status: DC

## 2019-12-20 MED ORDER — FAMOTIDINE 20 MG PO TABS: ORAL_TABLET | ORAL | 11 refills | Status: DC

## 2019-12-20 MED ORDER — PANTOPRAZOLE SODIUM 40 MG PO TBEC: 40.0000 mg | DELAYED_RELEASE_TABLET | Freq: Every day | ORAL | 2 refills | Status: DC

## 2019-12-20 NOTE — Progress Notes (Signed)
Subjective:    Patient ID: Sara Hamilton, female   DOB: 11/24/1986    MRN: [id number]   Brief patient profile:   33 yowf RT  never smoker with onset as teenager  seasonal rhinitis mostly in spring never assoc with cough/ wheeze or difficulty with athletic endeavors and actually improved over time since HS and nl IUP most recent 2012 then acutely ill with URI late Dec 2018 =  High fever, productive cough/ body aches with resolution w/in 24 h but persistent mostly  Cough was productive thick yellow  and later in Jan 2019 e visit > abx > turned dry persisted > eval in February rx steroid shot/ prednisone improved 50-75% but also took cough suppression > April  Second round of pred 50-75% > 04/23/18 started saba ? Better and  04/28/18 eval by Sara Hamilton 04/28/18  and allergy to grass/mold/guinea pig/dust weedd rec symb/saba no better yet (Dr Sara Hamilton got the idea she was bette p saba but she denies) and rx also with singulair which did not start  so self referred to pulmonary clinic 04/30/2018     History of Present Illness  04/30/2018 1st [facility] office visit/ Sara Hamilton   Chief Complaint  Patient presents with  . Pulmonary Consult    Self referred- c/o cough since late Dec 2018. She states that cough is mainly non prod and tends to get worse at night.   cough 24/7 gets worse p supper and peak right when head head hits pillow  Voice use aggravates  Off allegra nose got stuffy / runs at night > all better when back on allegra  On same BCP x 6 years  Both ribs ant hurt from coughing  Not limited by breathing from desired activities rec  First take delsym two tsp every 12 hours and supplement if needed with  tramadol 50 mg up to 2 every 4 hours to suppress the urge to cough at all or even clear your throat. Swallowing water or using ice chips/non mint and menthol containing candies (such as lifesavers or sugarless jolly ranchers) are also effective.  You should rest your voice and avoid activities  that you know make you cough. Once you have eliminated the cough for 3 straight days try reducing the tramadol first,  then the delsym as tolerated.   Prednisone 10 mg take  4 each am x 2 days,   2 each am x 2 days,  1 each am x 2 days and stop (this is to eliminate allergies and inflammation from coughing) Protonix (pantoprazole) Take 30-60 min before first and last meal of the day and Pepcid 20 mg one bedtime plus Chlorpheniramine 4 mg x 2 at bedtime (both available over the counter)  until cough is completely gone for at least a week without the need for cough suppression GERD diet    7/19/200 NP Cough recurred p above rx so rec Continue Symbicort 2 puffs twice daily- daily asthma maintenance  Continue Allegra, Singulair and nasacort - daily for allergy maintenance   Delsym or tessalon perle- for cough   07/04/2019  f/u ov/Sara Hamilton 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 Follow-up    Cough has resolved. She has not had to use her proair.    Dyspnea:  Not limited by breathing from desired activities  But no aerobics / some laps in ppol  Cough: none Sleeping: fine  SABA use: none  02: none  rec symbicort 160 Take  2 puffs first thing in am and then another 2 puffs about 12 hours later until 100% better for at least then ok to taper down and off Continue singulair and zyrtec  If not doing better take the prednisone Prednisone 10 mg take  4 each am x 2 days,   2 each am x 2 days,  1 each am x 2 days and stop    Virtual Visit via Telephone Note 12/20/2019   I connected with Sara Hamilton on 12/20/19 at  4:40 PM EST by telephone and verified that I am speaking with the correct person using two identifiers.   I discussed the limitations, risks, security and privacy concerns of performing an evaluation and management service by telephone and the availability of in person appointments. I also discussed with the patient that there may be a patient responsible  charge related to this service. The patient expressed understanding and agreed to proceed.   History of Present Illness: Off symbicort since aug 2020 on singulair / xyzal/flonase maint and did not restart symbicort  Onset of drippy nose Dec 10 2019 with much worse cough and did e-vist 12/30 rec zpak and pred x 6,  pos covid 01/14/2019  12/17/19 severe fatigue , HA and weird smell /taste no fever chills/ swets  Severe dry harsh cough min white prod  Dyspnea: limited by fatigue than sob  Cough:  Sleeping: fine p robitussin  SABA use: none 02: none   No obvious day to day or daytime variability or assoc excess/ purulent sputum or mucus plugs or hemoptysis or cp or chest tightness, subjective wheeze or overt sinus or hb symptoms.    Also denies any obvious fluctuation of symptoms with weather or environmental changes or other aggravating or alleviating factors except as outlined above.   Meds reviewed/ med reconciliation completed     Current Meds  Medication Sig  . albuterol (PROAIR HFA) 108 (90 Base) MCG/ACT inhaler Inhale 2 puffs into the lungs every 4 (four) hours as needed for wheezing or shortness of breath.  . desogestrel-ethinyl estradiol (APRI) 0.15-30 MG-MCG tablet Take 1 tablet by mouth daily.  . fluticasone (FLONASE) 50 MCG/ACT nasal spray Place 2 sprays into both nostrils at bedtime.   Marland Kitchen levocetirizine (XYZAL) 5 MG tablet Take 5 mg by mouth every evening.  . montelukast (SINGULAIR) 10 MG tablet TAKE 1 TABLET BY MOUTH AT BEDTIME.  Marland Kitchen           Observations/Objective: Looks fine until starts severe coughing fits with upper airway feature s air hunger or hoarseness/ stridor   Assessment and Plan: See problem list for active a/p's   Follow Up Instructions: See avs for instructions unique to this ov which includes revised/ updated med list     I discussed the assessment and treatment plan with the patient. The patient was provided an opportunity to ask questions and all  were answered. The patient agreed with the plan and demonstrated an understanding of the instructions.   The patient was advised to call back or seek an in-person evaluation if the symptoms worsen or if the condition fails to improve as anticipated.  I provided 25 minutes of non-face-to-face time during this encounter.   Christinia Gully, MD

## 2019-12-20 NOTE — Patient Instructions (Addendum)
Decadron 6 mg daily x 5 days   Delsym 2 tsp every 12 hours as needed   Anytime you have cough flare:  Pantoprazole (protonix) 40 mg   Take  30-60 min before first meal of the day and Pepcid (famotidine)  20 mg one @  bedtime until return to office - this is the best way to tell whether stomach acid is contributing to your problem.    GERD (REFLUX)  is an extremely common cause of respiratory symptoms just like yours , many times with no obvious heartburn at all.    It can be treated with medication, but also with lifestyle changes including elevation of the head of your bed (ideally with 6 -8inch blocks under the headboard of your bed),  Smoking cessation, avoidance of late meals, excessive alcohol, and avoid fatty foods, chocolate, peppermint, colas, red wine, and acidic juices such as orange juice.  NO MINT OR MENTHOL PRODUCTS SO NO COUGH DROPS  USE SUGARLESS CANDY INSTEAD (Jolley ranchers or Stover's or Life Savers) or even ice chips will also do - the key is to swallow to prevent all throat clearing. NO OIL BASED VITAMINS - use powdered substitutes.  Avoid fish oil when coughing.     No work until 12/26/19 and if not able to return then we need to see you in office

## 2019-12-20 NOTE — Telephone Encounter (Signed)
Video visit with MW scheduled for today at 4:30 pm

## 2019-12-20 NOTE — Assessment & Plan Note (Addendum)
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  While not intuitively obvious, many patients with chronic low grade reflux (which she is prone to on progesterone in bcp)  do not cough until there is a primary insult that disturbs the protective epithelial barrier and exposes sensitive nerve endings.   This is typically viral but can due to PNDS and  either may apply here.    >>>  The point is that once this occurs, it is difficult to eliminate the cycle  using anything but a maximally effective acid suppression regimen at least in the short run, accompanied by an appropriate diet to address non acid GERD and control / eliminate the cough itself with delsym and dex 6 mg daily x 5 days since likely a complication of COVID   No work until 12/26/19 and if not better then return here.    Each maintenance medication was reviewed in detail including most importantly the difference between maintenance and as needed and under what circumstances the prns are to be used.  Please see AVS for specific  Instructions which are unique to this visit and I personally typed out  which were reviewed in detail over the phone with the patient and a copy provided via My chart

## 2019-12-21 NOTE — Telephone Encounter (Signed)
Mychart message sent by pt wanting to know if paperwork was received by her work for her to be out of work until next week.  Verlon Au, please advise if this has been received for MW to fill out.

## 2019-12-22 NOTE — Telephone Encounter (Signed)
Nothing has been received

## 2019-12-26 NOTE — Telephone Encounter (Signed)
Dr. Wert, please see pt's mychart message. 

## 2019-12-26 NOTE — Telephone Encounter (Signed)
New mychart message received from pt after she contacted [employer]. Pt said that they should be refaxing [place] forms that need to be filled out but also said that any documentation from MW in regards to him releasing her to go back to work this week will be sufficient. Pt is scheduled to currently return back to work Thursday, 1/14.  [name], please advise.

## 2019-12-28 ENCOUNTER — Encounter: Payer: Self-pay | Admitting: *Deleted

## 2019-12-28 ENCOUNTER — Other Ambulatory Visit: Payer: Self-pay | Admitting: Internal Medicine

## 2019-12-28 ENCOUNTER — Other Ambulatory Visit: Payer: Self-pay | Admitting: Allergy & Immunology

## 2019-12-28 MED FILL — ALBUTEROL SULFATE HFA 108 (: 108 (90 BAS | 17 days supply | Qty: 9 | Fill #0

## 2019-12-28 NOTE — Telephone Encounter (Signed)
Fine to return to work 12/29/19 s restrictions

## 2019-12-28 NOTE — Telephone Encounter (Signed)
Dr Sherene Sires- I have updated her on the process of her FMLA papers  She is going to medical records per protocol to sign release of info  She is stating that she is scheduled to return to work tomorrow  Can we provide her with a note stating that this is okay? Please advise, thanks

## 2019-12-30 ENCOUNTER — Telehealth: Payer: Self-pay | Admitting: Internal Medicine

## 2019-12-30 NOTE — Telephone Encounter (Signed)
Rec'd fitness for duty form via interoffice mail from [employer] - will attach to previous form for [name] to complete -pr

## 2020-01-02 ENCOUNTER — Telehealth: Payer: Self-pay

## 2020-01-02 MED ORDER — ALBUTEROL SULFATE HFA 108 (90 BASE) MCG/ACT IN AERS: 2.0000 | INHALATION_SPRAY | Freq: Four times a day (QID) | RESPIRATORY_TRACT | 3 refills | Status: DC | PRN

## 2020-01-02 NOTE — Telephone Encounter (Signed)
Received fax from [facility] pharmacy requesting a PA for ProAir HFA.  On the PA it is listed that Ventolin HFA is a covered alternative on pt's formulary.  Pt has no documented allergy/adverse reaction to this, and this is an albuterol inhaler as well. New rx sent to pharmacy for covered alternative of albuterol inhaler.  Pharmacy made aware.  Nothing further needed at this time- will close encounter.

## 2020-01-02 NOTE — Telephone Encounter (Signed)
Received forms back will send to [employer]

## 2020-01-04 MED FILL — SYMBICORT 160-4.5 MCG INH: 160-4.5 | 30 days supply | Qty: 10 | Fill #3

## 2020-01-09 MED FILL — MONTELUKAST SOD 10 MG TAB: 10 | 30 days supply | Qty: 30 | Fill #3

## 2020-01-20 DIAGNOSIS — J302 Other seasonal allergic rhinitis: Secondary | ICD-10-CM | POA: Diagnosis not present

## 2020-01-20 DIAGNOSIS — E669 Obesity, unspecified: Secondary | ICD-10-CM | POA: Diagnosis not present

## 2020-01-20 DIAGNOSIS — K219 Gastro-esophageal reflux disease without esophagitis: Secondary | ICD-10-CM | POA: Diagnosis not present

## 2020-01-20 DIAGNOSIS — J4540 Moderate persistent asthma, uncomplicated: Secondary | ICD-10-CM | POA: Diagnosis not present

## 2020-01-20 DIAGNOSIS — Z6830 Body mass index (BMI) 30.0-30.9, adult: Secondary | ICD-10-CM | POA: Diagnosis not present

## 2020-01-24 NOTE — Telephone Encounter (Signed)
error 

## 2020-01-25 MED FILL — JULEBER 0.15-30 MG-MCG TABS: 0.15-30 | 84 days supply | Qty: 84 | Fill #3

## 2020-01-26 MED FILL — FAMOTIDINE 20 MG TABS: 20 | 30 days supply | Qty: 30 | Fill #0

## 2020-01-26 MED FILL — PANTOPRAZOLE SOD DR 40 MG T: 40 | 30 days supply | Qty: 30 | Fill #0

## 2020-02-06 DIAGNOSIS — Z6830 Body mass index (BMI) 30.0-30.9, adult: Secondary | ICD-10-CM | POA: Diagnosis not present

## 2020-02-06 DIAGNOSIS — K219 Gastro-esophageal reflux disease without esophagitis: Secondary | ICD-10-CM | POA: Diagnosis not present

## 2020-02-06 DIAGNOSIS — J4540 Moderate persistent asthma, uncomplicated: Secondary | ICD-10-CM | POA: Diagnosis not present

## 2020-02-06 DIAGNOSIS — J302 Other seasonal allergic rhinitis: Secondary | ICD-10-CM | POA: Diagnosis not present

## 2020-02-06 DIAGNOSIS — Z0189 Encounter for other specified special examinations: Secondary | ICD-10-CM | POA: Diagnosis not present

## 2020-02-06 DIAGNOSIS — E669 Obesity, unspecified: Secondary | ICD-10-CM | POA: Diagnosis not present

## 2020-02-07 DIAGNOSIS — E669 Obesity, unspecified: Secondary | ICD-10-CM | POA: Diagnosis not present

## 2020-02-07 DIAGNOSIS — Z6830 Body mass index (BMI) 30.0-30.9, adult: Secondary | ICD-10-CM | POA: Diagnosis not present

## 2020-02-07 DIAGNOSIS — K219 Gastro-esophageal reflux disease without esophagitis: Secondary | ICD-10-CM | POA: Diagnosis not present

## 2020-02-07 DIAGNOSIS — J329 Chronic sinusitis, unspecified: Secondary | ICD-10-CM | POA: Diagnosis not present

## 2020-02-07 DIAGNOSIS — J4540 Moderate persistent asthma, uncomplicated: Secondary | ICD-10-CM | POA: Diagnosis not present

## 2020-02-07 DIAGNOSIS — E782 Mixed hyperlipidemia: Secondary | ICD-10-CM | POA: Diagnosis not present

## 2020-02-07 DIAGNOSIS — J302 Other seasonal allergic rhinitis: Secondary | ICD-10-CM | POA: Diagnosis not present

## 2020-02-07 DIAGNOSIS — Z0001 Encounter for general adult medical examination with abnormal findings: Secondary | ICD-10-CM | POA: Diagnosis not present

## 2020-02-09 MED FILL — MONTELUKAST SOD 10 MG TAB: 10 | 30 days supply | Qty: 30 | Fill #4

## 2020-03-05 ENCOUNTER — Other Ambulatory Visit (HOSPITAL_COMMUNITY): Payer: Self-pay | Admitting: Otolaryngology

## 2020-03-05 ENCOUNTER — Other Ambulatory Visit: Payer: Self-pay | Admitting: Otolaryngology

## 2020-03-05 DIAGNOSIS — J342 Deviated nasal septum: Secondary | ICD-10-CM | POA: Diagnosis not present

## 2020-03-05 DIAGNOSIS — J343 Hypertrophy of nasal turbinates: Secondary | ICD-10-CM | POA: Diagnosis not present

## 2020-03-05 DIAGNOSIS — J310 Chronic rhinitis: Secondary | ICD-10-CM | POA: Diagnosis not present

## 2020-03-05 DIAGNOSIS — R0982 Postnasal drip: Secondary | ICD-10-CM | POA: Diagnosis not present

## 2020-03-05 DIAGNOSIS — J320 Chronic maxillary sinusitis: Secondary | ICD-10-CM

## 2020-03-05 MED FILL — IPRATROPIUM 0.06% SPRAY: 0.06 | 20 days supply | Qty: 15 | Fill #0

## 2020-03-05 MED FILL — predniSONE 10 MG TABS: 10 | 6 days supply | Qty: 21 | Fill #0

## 2020-03-05 MED FILL — MONTELUKAST SOD 10 MG TAB: 10 | 30 days supply | Qty: 30 | Fill #5

## 2020-03-14 ENCOUNTER — Other Ambulatory Visit: Payer: Self-pay

## 2020-03-14 ENCOUNTER — Ambulatory Visit (HOSPITAL_COMMUNITY)
Admission: RE | Admit: 2020-03-14 | Discharge: 2020-03-14 | Disposition: A | Discharge status: Home / Self Care | Payer: 59 | Source: Ambulatory Visit | Attending: Otolaryngology | Admitting: Otolaryngology

## 2020-03-14 DIAGNOSIS — J320 Chronic maxillary sinusitis: Secondary | ICD-10-CM | POA: Diagnosis not present

## 2020-03-16 ENCOUNTER — Ambulatory Visit (HOSPITAL_COMMUNITY): Admission: RE | Admit: 2020-03-16 | Payer: 59 | Source: Ambulatory Visit

## 2020-03-27 ENCOUNTER — Other Ambulatory Visit (HOSPITAL_COMMUNITY): Payer: Self-pay | Admitting: Respiratory Therapy

## 2020-03-27 DIAGNOSIS — R059 Cough, unspecified: Secondary | ICD-10-CM

## 2020-03-27 DIAGNOSIS — R05 Cough: Secondary | ICD-10-CM

## 2020-03-27 DIAGNOSIS — R0602 Shortness of breath: Secondary | ICD-10-CM

## 2020-04-04 DIAGNOSIS — J343 Hypertrophy of nasal turbinates: Secondary | ICD-10-CM | POA: Diagnosis not present

## 2020-04-04 DIAGNOSIS — J342 Deviated nasal septum: Secondary | ICD-10-CM | POA: Diagnosis not present

## 2020-04-04 DIAGNOSIS — J338 Other polyp of sinus: Secondary | ICD-10-CM | POA: Diagnosis not present

## 2020-04-04 DIAGNOSIS — J324 Chronic pansinusitis: Secondary | ICD-10-CM | POA: Diagnosis not present

## 2020-04-06 ENCOUNTER — Other Ambulatory Visit (HOSPITAL_COMMUNITY)
Admission: RE | Admit: 2020-04-06 | Discharge: 2020-04-06 | Disposition: A | Discharge status: Home / Self Care | Payer: 59 | Source: Ambulatory Visit | Attending: Internal Medicine | Admitting: Internal Medicine

## 2020-04-06 ENCOUNTER — Other Ambulatory Visit: Payer: Self-pay

## 2020-04-06 ENCOUNTER — Other Ambulatory Visit (HOSPITAL_COMMUNITY): Admission: RE | Admit: 2020-04-06 | Payer: 59 | Source: Ambulatory Visit

## 2020-04-06 DIAGNOSIS — Z01812 Encounter for preprocedural laboratory examination: Secondary | ICD-10-CM | POA: Diagnosis not present

## 2020-04-06 DIAGNOSIS — Z20822 Contact with and (suspected) exposure to covid-19: Secondary | ICD-10-CM | POA: Insufficient documentation

## 2020-04-07 LAB — SARS CORONAVIRUS 2 (TAT 6-24 HRS): SARS Coronavirus 2: NEGATIVE

## 2020-04-09 ENCOUNTER — Other Ambulatory Visit: Payer: Self-pay | Admitting: Otolaryngology

## 2020-04-10 ENCOUNTER — Other Ambulatory Visit: Payer: Self-pay

## 2020-04-10 ENCOUNTER — Encounter: Payer: Self-pay | Admitting: Internal Medicine

## 2020-04-10 ENCOUNTER — Ambulatory Visit (HOSPITAL_COMMUNITY)
Admission: RE | Admit: 2020-04-10 | Discharge: 2020-04-10 | Disposition: A | Discharge status: Home / Self Care | Payer: 59 | Source: Ambulatory Visit | Attending: Internal Medicine | Admitting: Internal Medicine

## 2020-04-10 ENCOUNTER — Ambulatory Visit (INDEPENDENT_AMBULATORY_CARE_PROVIDER_SITE_OTHER): Payer: 59 | Admitting: Internal Medicine

## 2020-04-10 DIAGNOSIS — R05 Cough: Secondary | ICD-10-CM

## 2020-04-10 DIAGNOSIS — R058 Other specified cough: Secondary | ICD-10-CM

## 2020-04-10 DIAGNOSIS — R0602 Shortness of breath: Secondary | ICD-10-CM | POA: Insufficient documentation

## 2020-04-10 DIAGNOSIS — J45991 Cough variant asthma: Secondary | ICD-10-CM

## 2020-04-10 LAB — PULMONARY FUNCTION TEST
DL/VA % pred: 94 %
DL/VA: 4.23 ml/min/mmHg/L
DLCO unc % pred: 87 %
DLCO unc: 20.07 ml/min/mmHg
FEF 25-75 Post: 2.71 L/sec
FEF 25-75 Pre: 1.73 L/sec
FEF2575-%Change-Post: 56 %
FEF2575-%Pred-Post: 79 %
FEF2575-%Pred-Pre: 50 %
FEV1-%Change-Post: 13 %
FEV1-%Pred-Post: 91 %
FEV1-%Pred-Pre: 81 %
FEV1-Post: 2.96 L
FEV1-Pre: 2.62 L
FEV1FVC-%Change-Post: 17 %
FEV1FVC-%Pred-Pre: 82 %
FEV6-%Change-Post: -2 %
FEV6-%Pred-Post: 95 %
FEV6-%Pred-Pre: 97 %
FEV6-Post: 3.64 L
FEV6-Pre: 3.75 L
FEV6FVC-%Change-Post: 0 %
FEV6FVC-%Pred-Post: 101 %
FEV6FVC-%Pred-Pre: 100 %
FVC-%Change-Post: -3 %
FVC-%Pred-Post: 94 %
FVC-%Pred-Pre: 97 %
FVC-Post: 3.65 L
FVC-Pre: 3.77 L
Post FEV1/FVC ratio: 81 %
Post FEV6/FVC ratio: 100 %
Pre FEV1/FVC ratio: 69 %
Pre FEV6/FVC Ratio: 99 %
RV % pred: 134 %
RV: 2.04 L
TLC % pred: 115 %
TLC: 6 L

## 2020-04-10 MED ORDER — TRAMADOL HCL 50 MG PO TABS: 50.0000 mg | ORAL_TABLET | ORAL | 0 refills | Status: AC | PRN

## 2020-04-10 MED ORDER — PREDNISONE 10 MG PO TABS
ORAL_TABLET | ORAL | 0 refills | Status: DC
Start: 2020-04-10 — End: 2020-04-23

## 2020-04-10 MED ORDER — ALBUTEROL SULFATE (2.5 MG/3ML) 0.083% IN NEBU
2.5000 mg | INHALATION_SOLUTION | Freq: Once | RESPIRATORY_TRACT | Status: AC
  Administered 2020-04-10: 2.5 mg via RESPIRATORY_TRACT

## 2020-04-10 MED FILL — JULEBER 0.15-30 MG-MCG TABS: 0.15-30 | 56 days supply | Qty: 56 | Fill #4

## 2020-04-10 MED FILL — MONTELUKAST SOD 10 MG TAB: 10 | 30 days supply | Qty: 30 | Fill #6

## 2020-04-10 MED FILL — FAMOTIDINE 20 MG TABS: 20 | 30 days supply | Qty: 30 | Fill #1

## 2020-04-10 MED FILL — PANTOPRAZOLE SOD DR 40 MG T: 40 | 30 days supply | Qty: 30 | Fill #1

## 2020-04-10 NOTE — Progress Notes (Signed)
Subjective:    Patient ID: Sara Hamilton, female   DOB: Dec 17, 1985    MRN: [id number]   Brief patient profile:   33 yowf RT  never smoker with onset as teenager  seasonal rhinitis mostly in spring never assoc with cough/ wheeze or difficulty with athletic endeavors and actually improved over time since HS and nl IUP most recent 2012 then acutely ill with URI late Dec 2018 =  High fever, productive cough/ body aches with resolution w/in 24 h but persistent mostly  Cough was productive thick yellow  and later in Jan 2019 e visit > abx > turned dry persisted > eval in February rx steroid shot/ prednisone improved 50-75% but also took cough suppression > April  Second round of pred 50-75% > 04/23/18 started saba ? Better and  04/28/18 eval by [name] 04/28/18  and allergy to grass/mold/guinea pig/dust weedd rec symb/saba no better yet (Dr [name] got the idea she was bette p saba but she denies) and rx also with singulair which did not start  so self referred to pulmonary clinic 04/30/2018     History of Present Illness  04/30/2018 1st [facility] office visit/ Jillianna Stanek   Chief Complaint  Patient presents with  . Pulmonary Consult    Self referred- c/o cough since late Dec 2018. She states that cough is mainly non prod and tends to get worse at night.   cough 24/7 gets worse p supper and peak right when head head hits pillow  Voice use aggravates  Off allegra nose got stuffy / runs at night > all better when back on allegra  On same BCP x 6 years  Both ribs ant hurt from coughing  Not limited by breathing from desired activities rec  First take delsym two tsp every 12 hours and supplement if needed with  tramadol 50 mg up to 2 every 4 hours to suppress the urge to cough at all or even clear your throat. Swallowing water or using ice chips/non mint and menthol containing candies (such as lifesavers or sugarless jolly ranchers) are also effective.  You should rest your voice and avoid activities  that you know make you cough. Once you have eliminated the cough for 3 straight days try reducing the tramadol first,  then the delsym as tolerated.   Prednisone 10 mg take  4 each am x 2 days,   2 each am x 2 days,  1 each am x 2 days and stop (this is to eliminate allergies and inflammation from coughing) Protonix (pantoprazole) Take 30-60 min before first and last meal of the day and Pepcid 20 mg one bedtime plus Chlorpheniramine 4 mg x 2 at bedtime (both available over the counter)  until cough is completely gone for at least a week without the need for cough suppression GERD diet    7/19/200 NP Cough recurred p above rx so rec Continue Symbicort 2 puffs twice daily- daily asthma maintenance  Continue Allegra, Singulair and nasacort - daily for allergy maintenance   Delsym or tessalon perle- for cough   07/04/2019  f/u ov/Shekira Drummer 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 Follow-up    Cough has resolved. She has not had to use her proair.    Dyspnea:  Not limited by breathing from desired activities  But no aerobics / some laps in ppol  Cough: none Sleeping: fine  SABA use: none  02: none  rec symbicort 160 Take  2 puffs first thing in am and then another 2 puffs about 12 hours later until 100% better for at least then ok to taper down and off Continue singulair and zyrtec  If not doing better take the prednisone Prednisone 10 mg take  4 each am x 2 days,   2 each am x 2 days,  1 each am x 2 days and stop    Virtual Visit via Telephone Note 12/20/2019   I connected with Daneil Dolin on 12/20/19 at  4:40 PM EST by telephone and verified that I am speaking with the correct person using two identifiers.   I discussed the limitations, risks, security and privacy concerns of performing an evaluation and management service by telephone and the availability of in person appointments. I also discussed with the patient that there may be a patient responsible  charge related to this service. The patient expressed understanding and agreed to proceed.   History of Present Illness: Off symbicort since aug 2020 on singulair / xyzal/flonase maint and did not restart symbicort  Onset of drippy nose Dec 10 2019 with much worse cough and did e-vist 12/30 rec zpak and pred x 6,  pos covid 01/14/2019  12/17/19 severe fatigue , HA and weird smell /taste no fever chills/ sweats  Severe dry harsh cough min white prod  Dyspnea: limited by fatigue than sob  Cough:  Sleeping: fine p robitussin  SABA use: none 02: none Current Meds  Medication Sig  . albuterol (PROAIR HFA) 108 (90 Base) MCG/ACT inhaler Inhale 2 puffs into the lungs every 4 (four) hours as needed for wheezing or shortness of breath.  . desogestrel-ethinyl estradiol (APRI) 0.15-30 MG-MCG tablet Take 1 tablet by mouth daily.  . fluticasone (FLONASE) 50 MCG/ACT nasal spray Place 2 sprays into both nostrils at bedtime.   Marland Kitchen levocetirizine (XYZAL) 5 MG tablet Take 5 mg by mouth every evening.  . montelukast (SINGULAIR) 10 MG tablet TAKE 1 TABLET BY MOUTH AT BEDTIME.  Marland Kitchen       Observations/Objective: Looks fine until starts severe coughing fits with upper airway feature s air hunger or hoarseness/ stridor rec Decadron 6 mg daily x 5 days Delsym 2 tsp every 12 hours as needed  Anytime you have cough flare:  Pantoprazole (protonix) 40 mg   Take  30-60 min before first meal of the day and Pepcid (famotidine)  20 mg one @  bedtime until return to office - this is the best way to tell whether stomach acid is contributing to your problem.   GERD diet  No work until 12/26/19 and if not able to return then we need to see you in office    Virtual Visit via Telephone Note 04/10/2020   I connected with Shada Nienaber on 04/10/20 at 10:30 AM EDT by telephone and verified that I am speaking with the correct person using two identifiers.   I discussed the limitations, risks, security and privacy concerns of  performing an evaluation and management service by telephone and the availability of in person appointments. I also discussed with the patient that there may be a patient responsible charge related to this service. The patient expressed understanding and agreed to proceed.   History of Present Illness: Planning on nasal surgery [place]  May 17th  Dyspnea: unless coughing fine  Cough: dry cough wore at hs but 24/7 x 2 days  Sleeping: better until 2 nights prior to OV   SABA use: albuterol seems to help some using  3-4 x per day 02: not  pfts today show minimal airflow obst/ fully reversible    No obvious day to day or daytime variability or assoc excess/ purulent sputum or mucus plugs or hemoptysis or cp or chest tightness, subjective wheeze or overt sinus or hb symptoms.    Also denies any obvious fluctuation of symptoms with weather or environmental changes or other aggravating or alleviating factors except as outlined above.   Meds reviewed/ med reconciliation completed        Observations/Objective: Harsh dry coughing fits, good voice texture, no sob    Assessment and Plan: See problem list for active a/p's   Follow Up Instructions: See avs for instructions unique to this ov which includes revised/ updated med list     I discussed the assessment and treatment plan with the patient. The patient was provided an opportunity to ask questions and all were answered. The patient agreed with the plan and demonstrated an understanding of the instructions.   The patient was advised to call back or seek an in-person evaluation if the symptoms worsen or if the condition fails to improve as anticipated.  I provided 25 minutes of non-face-to-face time during this encounter.   Sandrea Hughs, MD

## 2020-04-10 NOTE — Patient Instructions (Addendum)
Pantoprazole (protonix) 40 mg   Take  30-60 min before first meal of the day and Pepcid (famotidine)  20 mg one @  After supper  until return to office - this is the best way to tell whether stomach acid is contributing to your problem.     Prednisone 10 mg take  4 each am x 2 days,   2 each am x 2 days,  1 each am x 2 days and stop    Only use your albuterol as a rescue medication to be used if you can't catch your breath by resting or doing a relaxed purse lip breathing pattern.  - The less you use it, the better it will work when you need it. - Ok to use up to 2 puffs  every 4 hours if you must but call for immediate appointment if use goes up over your usual need - Don't leave home without it !!  (think of it like the spare tire for your car)    Take delsym two tsp every 12 hours and supplement if needed with  tramadol 50 mg up to 2 every 4 hours to suppress the urge to cough. Swallowing water and/or using ice chips/non mint and menthol containing candies (such as lifesavers or sugarless jolly ranchers) are also effective.  You should rest your voice and avoid activities that you know make you cough.  Once you have eliminated the cough for 3 straight days try reducing the tramadol first,  then the delsym as tolerated.     Please schedule a follow up office visit in 5 weeks, call sooner if needed in [place] office

## 2020-04-10 NOTE — Assessment & Plan Note (Signed)
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 so rec d/c symbicort  And rx as cyclical cough  - 06/2018 flaired off symb 160 while on singulair so resumed symb 160 2bid - 07/04/2019  After extensive coaching inhaler device,  effectiveness =    90% so rec cont symb 160 2 bid prn  - PFT's  04/10/2020  FEV1 2.96 (91 % ) ratio 0.81  p 13 % improvement from saba p no rx  prior to study   FV curve slt concave but normalizes after saba    Advised to hold off restarting symbicort   since cough is the main issue now and we have two potential other triggers to correct first  = sinus dz/ gerd then if starts coughing again go ahead with symb 80 2bid    Each maintenance medication was reviewed in detail including most importantly the difference between maintenance and as needed and under what circumstances the prns are to be used.  Please see AVS for specific  Instructions which are unique to this visit and I personally typed out  which were reviewed in detail over the phone with the patient and a copy provided per MyChart

## 2020-04-10 NOTE — Telephone Encounter (Signed)
Called and spoke with patient she has been scheduled for TELEVISIT with [name] at 10:30 today. Nothing further needed at this time.

## 2020-04-10 NOTE — Assessment & Plan Note (Signed)
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   Of the three most common causes of  Sub-acute / recurrent or chronic cough, only one (GERD)  can actually contribute to/ trigger  the other two (asthma and post nasal drip syndrome)  and perpetuate the cylce of cough.  While not intuitively obvious, many patients with chronic low grade reflux do not cough until there is a primary insult that disturbs the protective epithelial barrier and exposes sensitive nerve endings.   This is typically viral but can due to PNDS and  either may apply here.     >>>The point is that once this occurs, it is difficult to eliminate the cycle  using anything but a maximally effective acid suppression regimen at least in the short run, accompanied by an appropriate diet to address non acid GERD and control / eliminate the cough itself for at least 3 days with tramadol   plus also added 6 days of Prednisone in case of component of Th-2 driven upper or lower airways inflammation (if cough responds short term only to relapse befor return while will on rx for uacs that would point to allergic rhinitis/ asthma or eos bronchitis)      >>> f/u 5 weeks at [facility]  - call sooner prn

## 2020-04-23 ENCOUNTER — Other Ambulatory Visit: Payer: Self-pay

## 2020-04-23 ENCOUNTER — Encounter (HOSPITAL_BASED_OUTPATIENT_CLINIC_OR_DEPARTMENT_OTHER): Payer: Self-pay | Admitting: Otolaryngology

## 2020-04-26 ENCOUNTER — Other Ambulatory Visit (HOSPITAL_COMMUNITY)
Admission: RE | Admit: 2020-04-26 | Discharge: 2020-04-26 | Disposition: A | Discharge status: Home / Self Care | Payer: 59 | Source: Ambulatory Visit | Attending: Otolaryngology | Admitting: Otolaryngology

## 2020-04-26 DIAGNOSIS — Z01812 Encounter for preprocedural laboratory examination: Secondary | ICD-10-CM | POA: Diagnosis not present

## 2020-04-26 DIAGNOSIS — Z20822 Contact with and (suspected) exposure to covid-19: Secondary | ICD-10-CM | POA: Diagnosis not present

## 2020-04-26 LAB — SARS CORONAVIRUS 2 (TAT 6-24 HRS): SARS Coronavirus 2: NEGATIVE

## 2020-04-29 NOTE — Anesthesia Preprocedure Evaluation (Addendum)
Anesthesia Evaluation  Patient identified by MRN, date of birth, ID band Patient awake    Reviewed: Allergy & Precautions, NPO status , Patient's Chart, lab work & pertinent test results  Airway Mallampati: I  TM Distance: >3 FB Neck ROM: Full    Dental no notable dental hx. (+) Teeth Intact, Dental Advisory Given   Pulmonary asthma ,  Asthma well controlled- uses inhaler with coughing spells (?GERD/ post nasal drip)- last used inhaler this morning as instructed to do preop   Pulmonary exam normal breath sounds clear to auscultation       Cardiovascular negative cardio ROS Normal cardiovascular exam Rhythm:Regular Rate:Normal     Neuro/Psych negative neurological ROS  negative psych ROS   GI/Hepatic Neg liver ROS, GERD  Medicated and Controlled,Chronic cough- being worked up currently, PCP suspects GERD   Endo/Other  negative endocrine ROS  Renal/GU negative Renal ROS  negative genitourinary   Musculoskeletal negative musculoskeletal ROS (+)   Abdominal Normal abdominal exam  (+)   Peds  Hematology negative hematology ROS (+)   Anesthesia Other Findings Deviated septum, bilateral turbinate hypertrophy, sinusitis  Reproductive/Obstetrics negative OB ROS                           Anesthesia Physical Anesthesia Plan  ASA: II  Anesthesia Plan: General   Post-op Pain Management:    Induction: Intravenous  PONV Risk Score and Plan: 4 or greater and Ondansetron, Dexamethasone, Midazolam, Scopolamine patch - Pre-op and Treatment may vary due to age or medical condition  Airway Management Planned: Oral ETT  Additional Equipment: None  Intra-op Plan:   Post-operative Plan: Extubation in OR  Informed Consent: I have reviewed the patients History and Physical, chart, labs and discussed the procedure including the risks, benefits and alternatives for the proposed anesthesia with the patient  or authorized representative who has indicated his/her understanding and acceptance.     Dental advisory given  Plan Discussed with: CRNA  Anesthesia Plan Comments:        Anesthesia Quick Evaluation

## 2020-04-30 ENCOUNTER — Ambulatory Visit (HOSPITAL_BASED_OUTPATIENT_CLINIC_OR_DEPARTMENT_OTHER): Payer: 59 | Admitting: Certified Registered"

## 2020-04-30 ENCOUNTER — Encounter (HOSPITAL_BASED_OUTPATIENT_CLINIC_OR_DEPARTMENT_OTHER): Payer: Self-pay | Admitting: Otolaryngology

## 2020-04-30 ENCOUNTER — Encounter (HOSPITAL_BASED_OUTPATIENT_CLINIC_OR_DEPARTMENT_OTHER)
Admission: RE | Disposition: A | Discharge status: Home / Self Care | Payer: Self-pay | Source: Home / Self Care | Attending: Otolaryngology

## 2020-04-30 ENCOUNTER — Ambulatory Visit (HOSPITAL_BASED_OUTPATIENT_CLINIC_OR_DEPARTMENT_OTHER)
Admission: RE | Admit: 2020-04-30 | Discharge: 2020-04-30 | Disposition: A | Discharge status: Home / Self Care | Payer: 59 | Attending: Otolaryngology | Admitting: Otolaryngology

## 2020-04-30 ENCOUNTER — Other Ambulatory Visit: Payer: Self-pay

## 2020-04-30 DIAGNOSIS — J324 Chronic pansinusitis: Secondary | ICD-10-CM | POA: Diagnosis not present

## 2020-04-30 DIAGNOSIS — L219 Seborrheic dermatitis, unspecified: Secondary | ICD-10-CM | POA: Insufficient documentation

## 2020-04-30 DIAGNOSIS — J343 Hypertrophy of nasal turbinates: Secondary | ICD-10-CM | POA: Diagnosis not present

## 2020-04-30 DIAGNOSIS — J323 Chronic sphenoidal sinusitis: Secondary | ICD-10-CM | POA: Diagnosis not present

## 2020-04-30 DIAGNOSIS — J342 Deviated nasal septum: Secondary | ICD-10-CM | POA: Diagnosis not present

## 2020-04-30 DIAGNOSIS — J328 Other chronic sinusitis: Secondary | ICD-10-CM | POA: Diagnosis not present

## 2020-04-30 DIAGNOSIS — J3489 Other specified disorders of nose and nasal sinuses: Secondary | ICD-10-CM | POA: Insufficient documentation

## 2020-04-30 DIAGNOSIS — J322 Chronic ethmoidal sinusitis: Secondary | ICD-10-CM | POA: Insufficient documentation

## 2020-04-30 DIAGNOSIS — J321 Chronic frontal sinusitis: Secondary | ICD-10-CM | POA: Insufficient documentation

## 2020-04-30 DIAGNOSIS — J320 Chronic maxillary sinusitis: Secondary | ICD-10-CM | POA: Diagnosis not present

## 2020-04-30 DIAGNOSIS — J45909 Unspecified asthma, uncomplicated: Secondary | ICD-10-CM | POA: Insufficient documentation

## 2020-04-30 DIAGNOSIS — J338 Other polyp of sinus: Secondary | ICD-10-CM | POA: Diagnosis not present

## 2020-04-30 DIAGNOSIS — J4540 Moderate persistent asthma, uncomplicated: Secondary | ICD-10-CM | POA: Diagnosis not present

## 2020-04-30 HISTORY — PX: SINUS ENDO WITH FUSION: SHX5329

## 2020-04-30 HISTORY — PX: NASAL SEPTOPLASTY W/ TURBINOPLASTY: SHX2070

## 2020-04-30 HISTORY — PX: FRONTAL SINUS EXPLORATION: SHX6591

## 2020-04-30 HISTORY — PX: ETHMOIDECTOMY: SHX5197

## 2020-04-30 HISTORY — DX: Gastro-esophageal reflux disease without esophagitis: K21.9

## 2020-04-30 HISTORY — DX: Unspecified asthma, uncomplicated: J45.909

## 2020-04-30 HISTORY — PX: SPHENOIDECTOMY: SHX2421

## 2020-04-30 HISTORY — PX: MAXILLARY ANTROSTOMY: SHX2003

## 2020-04-30 LAB — POCT PREGNANCY, URINE: Preg Test, Ur: NEGATIVE

## 2020-04-30 SURGERY — SURGERY, PARANASAL SINUS, ENDOSCOPIC, WITH NASAL SEPTOPLASTY, TURBINOPLASTY, AND MAXILLARY SINUSOTOMY
Anesthesia: General | Site: Nose | Laterality: Bilateral

## 2020-04-30 MED ORDER — MUPIROCIN 2 % EX OINT
TOPICAL_OINTMENT | CUTANEOUS | Status: DC | PRN
  Administered 2020-04-30: 1 via TOPICAL

## 2020-04-30 MED ORDER — DEXMEDETOMIDINE HCL IN NACL 200 MCG/50ML IV SOLN
INTRAVENOUS | Status: AC
  Filled 2020-04-30: qty 50

## 2020-04-30 MED ORDER — SCOPOLAMINE 1 MG/3DAYS TD PT72
1.0000 | MEDICATED_PATCH | TRANSDERMAL | Status: DC
  Administered 2020-04-30: 1.5 mg via TRANSDERMAL

## 2020-04-30 MED ORDER — OXYCODONE-ACETAMINOPHEN 5-325 MG PO TABS: 1.0000 | ORAL_TABLET | ORAL | 0 refills | Status: AC | PRN

## 2020-04-30 MED ORDER — MIDAZOLAM HCL 5 MG/5ML IJ SOLN
INTRAMUSCULAR | Status: DC | PRN
  Administered 2020-04-30: 2 mg via INTRAVENOUS

## 2020-04-30 MED ORDER — PROPOFOL 10 MG/ML IV BOLUS
INTRAVENOUS | Status: AC
  Filled 2020-04-30: qty 40

## 2020-04-30 MED ORDER — EPHEDRINE SULFATE 50 MG/ML IJ SOLN
INTRAMUSCULAR | Status: DC | PRN
  Administered 2020-04-30: 5 mg via INTRAVENOUS

## 2020-04-30 MED ORDER — ACETAMINOPHEN 500 MG PO TABS
ORAL_TABLET | ORAL | Status: AC
  Filled 2020-04-30: qty 2

## 2020-04-30 MED ORDER — OXYCODONE HCL 5 MG PO TABS: 5.0000 mg | ORAL_TABLET | Freq: Once | ORAL | Status: DC | PRN

## 2020-04-30 MED ORDER — DEXAMETHASONE SODIUM PHOSPHATE 10 MG/ML IJ SOLN
INTRAMUSCULAR | Status: AC
  Filled 2020-04-30: qty 1

## 2020-04-30 MED ORDER — ONDANSETRON HCL 4 MG/2ML IJ SOLN
INTRAMUSCULAR | Status: AC
  Filled 2020-04-30: qty 2

## 2020-04-30 MED ORDER — HYDROMORPHONE HCL 1 MG/ML IJ SOLN
0.2500 mg | INTRAMUSCULAR | Status: DC | PRN
  Administered 2020-04-30: 0.5 mg via INTRAVENOUS

## 2020-04-30 MED ORDER — OXYMETAZOLINE HCL 0.05 % NA SOLN
NASAL | Status: DC | PRN
  Administered 2020-04-30: 1 via TOPICAL

## 2020-04-30 MED ORDER — FENTANYL CITRATE (PF) 100 MCG/2ML IJ SOLN
INTRAMUSCULAR | Status: DC | PRN
  Administered 2020-04-30: 100 ug via INTRAVENOUS
  Administered 2020-04-30: 50 ug via INTRAVENOUS

## 2020-04-30 MED ORDER — ROCURONIUM BROMIDE 100 MG/10ML IV SOLN
INTRAVENOUS | Status: DC | PRN
  Administered 2020-04-30: 60 mg via INTRAVENOUS

## 2020-04-30 MED ORDER — LIDOCAINE-EPINEPHRINE 1 %-1:100000 IJ SOLN
INTRAMUSCULAR | Status: DC | PRN
  Administered 2020-04-30: 3 mL

## 2020-04-30 MED ORDER — MIDAZOLAM HCL 2 MG/2ML IJ SOLN
INTRAMUSCULAR | Status: AC
  Filled 2020-04-30: qty 2

## 2020-04-30 MED ORDER — HYDROMORPHONE HCL 1 MG/ML IJ SOLN
INTRAMUSCULAR | Status: AC
  Filled 2020-04-30: qty 0.5

## 2020-04-30 MED ORDER — PROPOFOL 10 MG/ML IV BOLUS
INTRAVENOUS | Status: DC | PRN
  Administered 2020-04-30: 200 mg via INTRAVENOUS

## 2020-04-30 MED ORDER — LIDOCAINE-EPINEPHRINE 1 %-1:100000 IJ SOLN
INTRAMUSCULAR | Status: AC
  Filled 2020-04-30: qty 2

## 2020-04-30 MED ORDER — SUGAMMADEX SODIUM 200 MG/2ML IV SOLN
INTRAVENOUS | Status: DC | PRN
  Administered 2020-04-30: 170 mg via INTRAVENOUS

## 2020-04-30 MED ORDER — DEXAMETHASONE SODIUM PHOSPHATE 10 MG/ML IJ SOLN
INTRAMUSCULAR | Status: DC | PRN
  Administered 2020-04-30: 10 mg via INTRAVENOUS

## 2020-04-30 MED ORDER — MUPIROCIN 2 % EX OINT
TOPICAL_OINTMENT | CUTANEOUS | Status: AC
  Filled 2020-04-30: qty 22

## 2020-04-30 MED ORDER — PHENYLEPHRINE HCL (PRESSORS) 10 MG/ML IV SOLN
INTRAVENOUS | Status: DC | PRN
  Administered 2020-04-30: 40 ug via INTRAVENOUS

## 2020-04-30 MED ORDER — CEFAZOLIN SODIUM-DEXTROSE 2-3 GM-%(50ML) IV SOLR
INTRAVENOUS | Status: DC | PRN
  Administered 2020-04-30: 2 g via INTRAVENOUS

## 2020-04-30 MED ORDER — KETOROLAC TROMETHAMINE 30 MG/ML IJ SOLN: 30.0000 mg | Freq: Once | INTRAMUSCULAR | Status: DC | PRN

## 2020-04-30 MED ORDER — FENTANYL CITRATE (PF) 100 MCG/2ML IJ SOLN
INTRAMUSCULAR | Status: AC
  Filled 2020-04-30: qty 2

## 2020-04-30 MED ORDER — OXYCODONE HCL 5 MG/5ML PO SOLN: 5.0000 mg | Freq: Once | ORAL | Status: DC | PRN

## 2020-04-30 MED ORDER — MEPERIDINE HCL 25 MG/ML IJ SOLN: 6.2500 mg | INTRAMUSCULAR | Status: DC | PRN

## 2020-04-30 MED ORDER — PROMETHAZINE HCL 25 MG/ML IJ SOLN: 6.2500 mg | INTRAMUSCULAR | Status: DC | PRN

## 2020-04-30 MED ORDER — ONDANSETRON HCL 4 MG/2ML IJ SOLN
INTRAMUSCULAR | Status: DC | PRN
  Administered 2020-04-30: 4 mg via INTRAVENOUS

## 2020-04-30 MED ORDER — OXYMETAZOLINE HCL 0.05 % NA SOLN
NASAL | Status: AC
  Filled 2020-04-30: qty 30

## 2020-04-30 MED ORDER — ACETAMINOPHEN 500 MG PO TABS
1000.0000 mg | ORAL_TABLET | Freq: Once | ORAL | Status: AC
  Administered 2020-04-30: 1000 mg via ORAL

## 2020-04-30 MED ORDER — DEXMEDETOMIDINE HCL IN NACL 200 MCG/50ML IV SOLN
INTRAVENOUS | Status: DC | PRN
Start: 2020-04-30 — End: 2020-04-30
  Administered 2020-04-30 (×4): 4 ug via INTRAVENOUS

## 2020-04-30 MED ORDER — PHENYLEPHRINE 40 MCG/ML (10ML) SYRINGE FOR IV PUSH (FOR BLOOD PRESSURE SUPPORT)
PREFILLED_SYRINGE | INTRAVENOUS | Status: AC
  Filled 2020-04-30: qty 10

## 2020-04-30 MED ORDER — LACTATED RINGERS IV SOLN: INTRAVENOUS | Status: DC

## 2020-04-30 MED ORDER — ROCURONIUM BROMIDE 10 MG/ML (PF) SYRINGE
PREFILLED_SYRINGE | INTRAVENOUS | Status: AC
  Filled 2020-04-30: qty 10

## 2020-04-30 MED ORDER — SCOPOLAMINE 1 MG/3DAYS TD PT72
MEDICATED_PATCH | TRANSDERMAL | Status: AC
  Filled 2020-04-30: qty 1

## 2020-04-30 MED ORDER — CEFAZOLIN SODIUM 1 G IJ SOLR
INTRAMUSCULAR | Status: AC
  Filled 2020-04-30: qty 20

## 2020-04-30 MED ORDER — LIDOCAINE 2% (20 MG/ML) 5 ML SYRINGE
INTRAMUSCULAR | Status: DC | PRN
  Administered 2020-04-30: 60 mg via INTRAVENOUS

## 2020-04-30 MED ORDER — AMOXICILLIN 875 MG PO TABS: 875.0000 mg | ORAL_TABLET | Freq: Two times a day (BID) | ORAL | 0 refills | Status: AC

## 2020-04-30 MED ORDER — LIDOCAINE 2% (20 MG/ML) 5 ML SYRINGE
INTRAMUSCULAR | Status: AC
  Filled 2020-04-30: qty 5

## 2020-04-30 MED ORDER — EPHEDRINE 5 MG/ML INJ
INTRAVENOUS | Status: AC
  Filled 2020-04-30: qty 10

## 2020-04-30 MED FILL — OXYCODONE-ACETAMINOPHEN 5-3: 5-325 | 3 days supply | Qty: 18 | Fill #0

## 2020-04-30 MED FILL — AMOXICILLIN 875 MG TABS: 875 | 3 days supply | Qty: 6 | Fill #0

## 2020-04-30 SURGICAL SUPPLY — 53 items
ATTRACTOMAT 16X20 MAGNETIC DRP (DRAPES) IMPLANT
BLADE RAD40 ROTATE 4M 4 5PK (BLADE) IMPLANT
BLADE RAD60 ROTATE M4 4 5PK (BLADE) IMPLANT
BLADE ROTATE RAD 12 4 M4 (BLADE) IMPLANT
BLADE ROTATE RAD 40 4 M4 (BLADE) IMPLANT
BLADE ROTATE TRICUT 4X13 M4 (BLADE) ×2 IMPLANT
BLADE TRICUT ROTATE M4 4 5PK (BLADE) IMPLANT
BUR HS RAD FRONTAL 3 (BURR) IMPLANT
CANISTER SUC SOCK COL 7IN (MISCELLANEOUS) ×2 IMPLANT
CANISTER SUCT 1200ML W/VALVE (MISCELLANEOUS) ×4 IMPLANT
COAGULATOR SUCT 8FR VV (MISCELLANEOUS) ×2 IMPLANT
COVER WAND RF STERILE (DRAPES) IMPLANT
DECANTER SPIKE VIAL GLASS SM (MISCELLANEOUS) IMPLANT
DRSG NASAL KENNEDY LMNT 8CM (GAUZE/BANDAGES/DRESSINGS) IMPLANT
DRSG NASOPORE 8CM (GAUZE/BANDAGES/DRESSINGS) ×2 IMPLANT
DRSG TELFA 3X8 NADH (GAUZE/BANDAGES/DRESSINGS) IMPLANT
ELECT REM PT RETURN 9FT ADLT (ELECTROSURGICAL) ×2
ELECTRODE REM PT RTRN 9FT ADLT (ELECTROSURGICAL) ×1 IMPLANT
GLOVE BIO SURGEON STRL SZ7.5 (GLOVE) ×2 IMPLANT
GLOVE BIOGEL PI IND STRL 7.0 (GLOVE) ×1 IMPLANT
GLOVE BIOGEL PI INDICATOR 7.0 (GLOVE) ×1
GLOVE ECLIPSE 6.5 STRL STRAW (GLOVE) ×2 IMPLANT
GOWN STRL REUS W/ TWL LRG LVL3 (GOWN DISPOSABLE) ×3 IMPLANT
GOWN STRL REUS W/TWL LRG LVL3 (GOWN DISPOSABLE) ×6
HEMOSTAT SURGICEL 2X14 (HEMOSTASIS) IMPLANT
IV NS 500ML (IV SOLUTION) ×2
IV NS 500ML BAXH (IV SOLUTION) ×1 IMPLANT
NEEDLE HYPO 25X1 1.5 SAFETY (NEEDLE) ×2 IMPLANT
NEEDLE SPNL 25GX3.5 QUINCKE BL (NEEDLE) IMPLANT
NS IRRIG 1000ML POUR BTL (IV SOLUTION) ×2 IMPLANT
PACK ENT DAY SURGERY (CUSTOM PROCEDURE TRAY) ×2 IMPLANT
SET BASIN DAY SURGERY F.S. (CUSTOM PROCEDURE TRAY) ×2 IMPLANT
SLEEVE SCD COMPRESS KNEE MED (MISCELLANEOUS) ×2 IMPLANT
SOLUTION BUTLER CLEAR DIP (MISCELLANEOUS) ×2 IMPLANT
SPLINT NASAL AIRWAY SILICONE (MISCELLANEOUS) ×2 IMPLANT
SPONGE GAUZE 2X2 8PLY STRL LF (GAUZE/BANDAGES/DRESSINGS) ×2 IMPLANT
SPONGE NEURO XRAY DETECT 1X3 (DISPOSABLE) ×2 IMPLANT
SUCTION FRAZIER HANDLE 10FR (MISCELLANEOUS)
SUCTION TUBE FRAZIER 10FR DISP (MISCELLANEOUS) IMPLANT
SUT CHROMIC 4 0 P 3 18 (SUTURE) ×2 IMPLANT
SUT PLAIN 4 0 ~~LOC~~ 1 (SUTURE) ×2 IMPLANT
SUT PROLENE 3 0 PS 2 (SUTURE) ×2 IMPLANT
SUT VIC AB 4-0 P-3 18XBRD (SUTURE) IMPLANT
SUT VIC AB 4-0 P3 18 (SUTURE)
SYR 50ML LL SCALE MARK (SYRINGE) ×2 IMPLANT
TOWEL GREEN STERILE FF (TOWEL DISPOSABLE) ×2 IMPLANT
TRACKER ENT INSTRUMENT (MISCELLANEOUS) ×2 IMPLANT
TRACKER ENT PATIENT (MISCELLANEOUS) ×2 IMPLANT
TUBE CONNECTING 20X1/4 (TUBING) ×2 IMPLANT
TUBE SALEM SUMP 12R W/ARV (TUBING) IMPLANT
TUBE SALEM SUMP 16 FR W/ARV (TUBING) ×2 IMPLANT
TUBING STRAIGHTSHOT EPS 5PK (TUBING) ×2 IMPLANT
YANKAUER SUCT BULB TIP NO VENT (SUCTIONS) ×2 IMPLANT

## 2020-04-30 NOTE — Anesthesia Procedure Notes (Signed)
Procedure Name: Intubation Date/Time: 04/30/2020 7:39 AM Performed by: Lauralyn Primes, CRNA Pre-anesthesia Checklist: Patient identified, Emergency Drugs available, Suction available and Patient being monitored Patient Re-evaluated:Patient Re-evaluated prior to induction Oxygen Delivery Method: Circle system utilized Preoxygenation: Pre-oxygenation with 100% oxygen Induction Type: IV induction Ventilation: Mask ventilation without difficulty Laryngoscope Size: Miller and 2 Grade View: Grade I Tube type: Oral Tube size: 7.0 mm Number of attempts: 1 Airway Equipment and Method: Stylet and Bite block Placement Confirmation: ETT inserted through vocal cords under direct vision,  positive ETCO2 and breath sounds checked- equal and bilateral Secured at: 23 cm Tube secured with: Tape Dental Injury: Teeth and Oropharynx as per pre-operative assessment

## 2020-04-30 NOTE — H&P (Signed)
Cc: Chronic rhinosinusitis and nasal obstruction  HPI: The patient is a 34 year old female who returns today for follow-up evaluation. of her chronic sinus infections.  The patient was last seen 1 month ago.  At that time, she was noted to have chronic rhinitis, nasal mucosal congestion, septal deviation, and bilateral inferior turbinate hypertrophy. The patient has been experiencing frequent recurrent sinusitis for the past year.  She was treated with 4 courses of extended antibiotics.  She recently underwent a sinus CT scan.  The CT showed bilateral pansinusitis with obstruction of all the paranasal sinus drainage pathways.  Increased density of secretions was also noted in her sinus cavities, suggestive of possible fungal elements.  After her CT scan, the patient was treated with another course of Levofloxacin antibiotic. The patient returns today complaining of persistent symptoms.  She is having significant nasal obstruction, facial pressure, nasal drainage, and frequent cough.  The patient was recently treated with systemic and topical steroids.    Exam: The flexible scope was inserted into the right nasal cavity.  Endoscopy of the interior nasal cavity, superior, inferior, and middle meatus was performed. The sphenoid-ethmoid recess was examined. Edematous and erythematous mucosa was noted.  Nasal septal deviation noted.  Olfactory cleft was clear.  Nasopharynx was clear.  Turbinates were hypertrophied but without mass.  Incomplete response to decongestion.  The procedure was repeated on the contralateral side with similar findings.  The patient tolerated the procedure well.  Instructions were given to avoid eating or drinking for 2 hours.  Assessment: 1.  Bilateral chronic pansinusitis with frequent recurrent exacerbations. The patient was treated with multiple courses of antibiotics, systemic and topical steroids, and allergy medications without improvement in her symptoms.  2.  The patient's CT  scan showed obstruction of all paranasal sinuses.   3.  Nasal septal deviation and bilateral inferior turbinate hypertrophy.   Plan: 1.  The nasal endoscopy findings and the CT images are reviewed with the patient.  2.  The patient should continue with her current allergy treatment regimen, including daily Flonase nasal spray.   3.  Based on the above findings, she will likely benefit from surgical intervention with septoplasty, turbinate reduction, and bilateral endoscopic sinus surgery.  The risks, benefits, alternatives and details of the procedures are reviewed with the patient.   4.  The patient would like to proceed with the procedures.

## 2020-04-30 NOTE — Discharge Instructions (Signed)
No Tylenol until 1:00pm if needed.   Post Anesthesia Home Care Instructions  Activity: Get plenty of rest for the remainder of the day. A responsible individual must stay with you for 24 hours following the procedure.  For the next 24 hours, DO NOT: -Drive a car -Paediatric nurse -Drink alcoholic beverages -Take any medication unless instructed by your physician -Make any legal decisions or sign important papers.  Meals: Start with liquid foods such as gelatin or soup. Progress to regular foods as tolerated. Avoid greasy, spicy, heavy foods. If nausea and/or vomiting occur, drink only clear liquids until the nausea and/or vomiting subsides. Call your physician if vomiting continues.  Special Instructions/Symptoms: Your throat may feel dry or sore from the anesthesia or the breathing tube placed in your throat during surgery. If this causes discomfort, gargle with warm salt water. The discomfort should disappear within 24 hours.  If you had a scopolamine patch placed behind your ear for the management of post- operative nausea and/or vomiting:  1. The medication in the patch is effective for 72 hours, after which it should be removed.  Wrap patch in a tissue and discard in the trash. Wash hands thoroughly with soap and water. 2. You may remove the patch earlier than 72 hours if you experience unpleasant side effects which may include dry mouth, dizziness or visual disturbances. 3. Avoid touching the patch. Wash your hands with soap and water after contact with the patch.    -----------------------  POSTOPERATIVE INSTRUCTIONS FOR PATIENTS HAVING NASAL OR SINUS OPERATIONS ACTIVITY: Restrict activity at home for the first two days, resting as much as possible. Light activity is best. You may usually return to work within a week. You should refrain from nose blowing, strenuous activity, or heavy lifting greater than 20lbs for a total of one week after your operation.  If sneezing cannot be  avoided, sneeze with your mouth open. DISCOMFORT: You may experience a dull headache and pressure along with nasal congestion and discharge. These symptoms may be worse during the first week after the operation but may last as long as two to four weeks.  Please take Tylenol or the pain medication that has been prescribed for you. Do not take aspirin or aspirin containing medications since they may cause bleeding.  You may experience symptoms of post nasal drainage, nasal congestion, headaches and fatigue for two or three months after your operation.  BLEEDING: You may have some blood tinged nasal drainage for approximately two weeks after the operation.  The discharge will be worse for the first week.  Please call our office at [phone] or go to the nearest hospital emergency room if you experience any of the following: heavy, bright red blood from your nose or mouth that lasts longer than 15 minutes or coughing up or vomiting bright red blood or blood clots. GENERAL CONSIDERATIONS: 1. A gauze dressing will be placed on your upper lip to absorb any drainage after the operation. You may need to change this several times a day.  If you do not have very much drainage, you may remove the dressing.  Remember that you may gently wipe your nose with a tissue and sniff in, but DO NOT blow your nose. 2. Please keep all of your postoperative appointments.  Your final results after the operation will depend on proper follow-up.  The initial visit is usually 2 to 5 days after the operation.  During this visit, the remaining nasal packing and internal septal splints will be  removed.  Your nasal and sinus cavities will be cleaned.  During the second visit, your nasal and sinus cavities will be cleaned again. Have someone drive you to your first two postoperative appointments.  3. How you care for your nose after the operation will influence the results that you obtain.  You should follow all directions, take your  medication as prescribed, and call our office [phone] with any problems or questions. 4. You may be more comfortable sleeping with your head elevated on two pillows. 5. Do not take any medications that we have not prescribed or recommended. WARNING SIGNS: if any of the following should occur, please call our office: 1. Persistent fever greater than 102F. 2. Persistent vomiting. 3. Severe and constant pain that is not relieved by prescribed pain medication. 4. Trauma to the nose. 5. Rash or unusual side effects from any medicines.

## 2020-04-30 NOTE — Anesthesia Postprocedure Evaluation (Signed)
Anesthesia Post Note  Patient: Sara Hamilton  Procedure(s) Performed: SINUS ENDOSCOPY WITH FUSION NAVIGATION (Bilateral Nose) NASAL SEPTOPLASTY WITH TURBINATE REDUCTION (Bilateral Nose) BILATERAL MAXILLARY ANTROSTOMY WITH TISSUE REMOVAL (Bilateral Nose) BILATERAL FRONTAL RECESS EXPLORATION (Bilateral Nose) BILATERAL ETHMOIDECTOMY (Bilateral Nose) BILATERAL SPHENOIDECTOMY (Bilateral Nose)     Patient location during evaluation: PACU Anesthesia Type: General Level of consciousness: awake and alert, oriented and patient cooperative Pain management: pain level controlled Vital Signs Assessment: post-procedure vital signs reviewed and stable Respiratory status: spontaneous breathing, nonlabored ventilation and respiratory function stable Cardiovascular status: blood pressure returned to baseline and stable Postop Assessment: no apparent nausea or vomiting Anesthetic complications: no    Last Vitals:  Vitals:   04/30/20 1015 04/30/20 1030  BP: 126/77 120/78  Pulse: 81 77  Resp: 12 13  Temp: 37.2 C   SpO2: 100% 97%    Last Pain:  Vitals:   04/30/20 1030  TempSrc:   PainSc: Asleep                 Lannie Fields

## 2020-04-30 NOTE — Op Note (Signed)
DATE OF PROCEDURE: 04/30/2020  OPERATIVE REPORT   SURGEON: Leta Baptist, MD   PREOPERATIVE DIAGNOSES:  1. Bilateral chronic pansinusitis and polyposis. 2. Nasal septal deviation.  3. Bilateral inferior turbinate hypertrophy.  4. Chronic nasal obstruction.  POSTOPERATIVE DIAGNOSES:  1. Bilateral chronic pansinusitis and polyposis. 2. Nasal septal deviation.  3. Bilateral inferior turbinate hypertrophy.  4. Chronic nasal obstruction.  PROCEDURE PERFORMED:  1. Bilateral endoscopic frontal sinusotomy with polyp removal. 2. Bilateral endoscopic total ethmoidectomy and sphenoidotomy with polyp removal. 3. Bilateral endoscopic maxillary antrostomy with polyp removal. 4. Septoplasty.  5. Bilateral partial inferior turbinate resection.  6. FUSION stereotactic image guidance.  ANESTHESIA: General endotracheal tube anesthesia.   COMPLICATIONS: None.   ESTIMATED BLOOD LOSS: 300 mL.   INDICATION FOR PROCEDURE: Sara Hamilton is a 34 y.o. female with a history of chronic rhinosinusitis and chronic nasal obstruction. The patient was recently treated with 5 courses of extended antibiotics, antihistamine, decongestant, systemic steroid, and steroid nasal sprays. However, the patient continued to be symptomatic. On examination, the patient was noted to have bilateral inferior turbinate hypertrophy and nasal septal deviation, causing significant nasal obstruction.  Her CT scan showed bilateral pansinusitis with obstruction of all the paranasal sinus drainage pathways.  Increased density of secretion was also noted in the sinus cavities, suggestive of possible fungal elements.  Based on the above findings, the decision was made for the patient to undergo the above-stated procedures. The risks, benefits, alternatives, and details of the procedures were discussed with the patient. Questions were invited and answered. Informed consent was obtained.   DESCRIPTION OF PROCEDURE: The patient was taken to the  operating room and placed supine on the operating table. General endotracheal tube anesthesia was administered by the anesthesiologist. The patient was positioned, and prepped and draped in the standard fashion for nasal surgery. Pledgets soaked with Afrin were placed in both nasal cavities for decongestion. The pledgets were subsequently removed. The FUSION stereotactic image guidance marker was placed. The image guidance system was functional throughout the case.  Examination of the nasal cavity revealed a severe nasal septal deviation. 1% lidocaine with 1:100,000 epinephrine was injected onto the nasal septum bilaterally. A hemitransfixion incision was made on the left side. The mucosal flap was carefully elevated on the left side. A cartilaginous incision was made 1 cm superior to the caudal margin of the nasal septum. Mucosal flap was also elevated on the right side in the similar fashion. It should be noted that due to the septal deviation, the deviated portion of the cartilaginous and bony septum had to be removed in piecemeal fashion. Once the deviated portions were removed, a straight midline septum was achieved. The septum was then quilted with 4-0 plain gut sutures. The hemitransfixion incision was closed with interrupted 4-0 chromic sutures.   The inferior one half of both hypertrophied inferior turbinate was crossclamped with a Kelly clamp. The inferior one half of each inferior turbinate was then resected with a pair of cross cutting scissors. Hemostasis was achieved with a suction cautery device.   Using a 0 endoscope, the left nasal cavity was examined. A large middle turbinate was noted. Using Tru-Cut forceps, the inferior one third and medial one half of the middle turbinate was resected. Polypoid tissue was noted within the middle meatus. The polypoid tissue was removed using a combination of microdebrider and Blakesley forceps. The uncinate process was resected with a freer elevator. The  maxillary antrum was entered and enlarged using a combination of backbiter and microdebrider.  Polypoid tissue was removed from the left maxillary sinus.  Attention was then focused on the ethmoid sinuses. The bony partitions of the anterior and posterior ethmoid cavities were taken down. Polypoid tissue was noted and removed.  More polyps were noted to obstruct the left sphenoid opening. The polyps were removed. The sphenoid opening was entered and enlarged.  More polypoid tissue was removed from within the sphenoid sinus. Attention was then focused on the frontal sinus. The frontal recess was identified and enlarged by removing the surrounding bony partitions. Polypoid tissue was removed from the frontal recess. All 4 paranasal sinuses were copiously irrigated with saline solution.  The same procedure was repeated on the right side without exception. More polyps were noted on the right side. All polyps were removed. Doyle splints were applied to the nasal septum.  The care of the patient was turned over to the anesthesiologist. The patient was awakened from anesthesia without difficulty. The patient was extubated and transferred to the recovery room in good condition.   OPERATIVE FINDINGS: Nasal septal deviation and bilateral inferior turbinate hypertrophy.  Bilateral chronic pansinusitis and polyposis.  SPECIMEN: Bilateral sinus contents.  FOLLOWUP CARE: The patient be discharged home once she is awake and alert. The patient will be placed on Percocet p.r.n. pain, and amoxicillin 875 mg p.o. b.i.d. for 3 days. The patient will follow up in my office in 4 days for splint removal.   Ladana Chavero Philomena Doheny, MD

## 2020-04-30 NOTE — Transfer of Care (Signed)
Immediate Anesthesia Transfer of Care Note  Patient: Sara Hamilton  Procedure(s) Performed: SINUS ENDOSCOPY WITH FUSION NAVIGATION (Bilateral Nose) NASAL SEPTOPLASTY WITH TURBINATE REDUCTION (Bilateral Nose) BILATERAL MAXILLARY ANTROSTOMY WITH TISSUE REMOVAL (Bilateral Nose) BILATERAL FRONTAL RECESS EXPLORATION (Bilateral Nose) BILATERAL ETHMOIDECTOMY (Bilateral Nose) BILATERAL SPHENOIDECTOMY (Bilateral Nose)  Patient Location: PACU  Anesthesia Type:General  Level of Consciousness: awake, alert  and oriented  Airway & Oxygen Therapy: Patient Spontanous Breathing and Patient connected to face mask oxygen  Post-op Assessment: Report given to RN and Post -op Vital signs reviewed and stable  Post vital signs: Reviewed and stable  Last Vitals:  Vitals Value Taken Time  BP 126/77 04/30/20 1015  Temp    Pulse 78 04/30/20 1020  Resp 15 04/30/20 1020  SpO2 100 % 04/30/20 1020  Vitals shown include unvalidated device data.  Last Pain:  Vitals:   04/30/20 0628  TempSrc: Tympanic  PainSc: 0-No pain         Complications: No apparent anesthesia complications

## 2020-05-01 DIAGNOSIS — E119 Type 2 diabetes mellitus without complications: Secondary | ICD-10-CM | POA: Diagnosis not present

## 2020-05-01 DIAGNOSIS — H524 Presbyopia: Secondary | ICD-10-CM | POA: Diagnosis not present

## 2020-05-01 DIAGNOSIS — Z7984 Long term (current) use of oral hypoglycemic drugs: Secondary | ICD-10-CM | POA: Diagnosis not present

## 2020-05-01 LAB — SURGICAL PATHOLOGY

## 2020-05-02 ENCOUNTER — Encounter: Payer: Self-pay | Admitting: *Deleted

## 2020-05-03 DIAGNOSIS — J324 Chronic pansinusitis: Secondary | ICD-10-CM | POA: Diagnosis not present

## 2020-05-03 DIAGNOSIS — J338 Other polyp of sinus: Secondary | ICD-10-CM | POA: Diagnosis not present

## 2020-05-09 ENCOUNTER — Other Ambulatory Visit: Payer: Self-pay | Admitting: Internal Medicine

## 2020-05-09 DIAGNOSIS — R058 Other specified cough: Secondary | ICD-10-CM

## 2020-05-09 MED FILL — FAMOTIDINE 20 MG TABS: 20 | 30 days supply | Qty: 30 | Fill #2

## 2020-05-09 MED FILL — PANTOPRAZOLE SOD DR 40 MG T: 40 | 30 days supply | Qty: 30 | Fill #0

## 2020-05-09 MED FILL — MONTELUKAST SOD 10 MG TAB: 10 | 30 days supply | Qty: 30 | Fill #0

## 2020-05-15 ENCOUNTER — Other Ambulatory Visit: Payer: Self-pay

## 2020-05-15 ENCOUNTER — Encounter: Payer: Self-pay | Admitting: Gastroenterology

## 2020-05-15 ENCOUNTER — Encounter: Payer: Self-pay | Admitting: Pulmonary Disease

## 2020-05-15 ENCOUNTER — Ambulatory Visit (INDEPENDENT_AMBULATORY_CARE_PROVIDER_SITE_OTHER): Payer: 59 | Admitting: Pulmonary Disease

## 2020-05-15 DIAGNOSIS — J45991 Cough variant asthma: Secondary | ICD-10-CM | POA: Diagnosis not present

## 2020-05-15 DIAGNOSIS — R05 Cough: Secondary | ICD-10-CM | POA: Diagnosis not present

## 2020-05-15 DIAGNOSIS — R058 Other specified cough: Secondary | ICD-10-CM

## 2020-05-15 MED ORDER — HYDROCODONE-HOMATROPINE 5-1.5 MG/5ML PO SYRP: 5.0000 mL | ORAL_SOLUTION | Freq: Four times a day (QID) | ORAL | 0 refills | Status: DC | PRN

## 2020-05-15 MED FILL — HYDROCODONE-HOMATROPINE SOL: 5-1.5 | 6 days supply | Qty: 120 | Fill #0

## 2020-05-15 NOTE — Progress Notes (Signed)
Virtual Visit via Telephone Note  I connected with Sara Hamilton on 05/15/20 at 10:00 AM EDT by telephone and verified that I am speaking with the correct person using two identifiers.  Location: Patient: Home Provider: Office [facility] - [address] [place], [address], [place], [place] [address]   I discussed the limitations, risks, security and privacy concerns of performing an evaluation and management service by telephone and the availability of in person appointments. I also discussed with the patient that there may be a patient responsible charge related to this service. The patient expressed understanding and agreed to proceed.  Patient consented to consult via telephone: Yes People present and their role in pt care: Pt    History of Present Illness:  34 year old female never smoker followed in our office by Sara Hamilton for cough.  Past medical history: Allergic rhinitis, deviated nasal septum Smoking history: Never smoker Maintenance: none Patient of Sara Hamilton  Chief complaint: Cough  34 year old female never smoker followed in our office for cough patient of Sara Hamilton.  Last follow-up was in April/2021 which was a virtual visit.  Patient is status post sinus surgery in May/2021 with Dr. Suszanne Hamilton.   Patient completing televisit at work today.  She reports that her cough has worsened.  She still feels like the cough is happening worse at night.  She reports adherence to all of her medications.  She denies any persistent acid reflux symptoms.  She has been sleeping in a recliner without much help.  She feels that she may need a another round of steroids as well as tramadol which is what Sara Hamilton has done in the past.  She is no longer on a maintenance inhaler although she feels that she may have clinically benefited from being on Symbicort.  She reports that her maintenance inhaler was stopped by Sara Hamilton.   Observations/Objective:  03/14/2020-CT maxillofacial-significant paranasal  sinus inflammatory changes with occlusion of the drainage pathway, increased density of secretions may reflect inside patient with superimposed fungal colonization of excluded, acute sinusitis is possible in the appropriate clinical setting  03/27/2020-pulmonary function test-FVC 3.77 (97% predicted), postbronchodilator ratio 81, postbronchodilator FEV1 2.96 (91% predicted), positive bronchodilator response in FEV1, mid flow reversibility, DLCO 20.07 (87% predicted)  Social History   Tobacco Use  Smoking Status Never Smoker  Smokeless Tobacco Never Used   Immunization History  Administered Date(s) Administered  . Influenza-Unspecified 09/04/2016  . 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   I discussed the assessment and treatment plan with the patient. The patient was provided an opportunity to ask questions and all were answered. The patient agreed with the plan and demonstrated an understanding of the instructions.   The patient was advised to call back or seek an in-person evaluation if the symptoms worsen or if the condition fails to improve as anticipated.  I provided 28 minutes of non-face-to-face time during this encounter.  Sara Rinne, NP

## 2020-05-15 NOTE — Assessment & Plan Note (Signed)
Plan: We will schedule close follow-up for patient in office to be evaluated by [name] at [facility] clinic on 05/16/2020

## 2020-05-15 NOTE — Patient Instructions (Signed)
You were seen today by Coral Ceo, NP  for:  1. Upper airway cough syndrome  - HYDROcodone-homatropine (HYCODAN) 5-1.5 MG/5ML syrup; Take 5 mLs by mouth every 6 (six) hours as needed for cough.  Dispense: 120 mL; Refill: 0  2. 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 HYDROcodone-homatropine (HYCODAN) 5-1.5 MG/5ML syrup    Sig: Take 5 mLs by mouth every 6 (six) hours as needed for cough.    Dispense:  120 mL    Refill:  0    Follow Up:    Return in about 1 day (around 05/16/2020), or if symptoms worsen or fail to improve, for Follow up with [name].   Please do your part to reduce the spread of COVID-19:      Reduce your risk of any infection  and COVID19 by using the similar precautions used for avoiding the common cold or flu:  Marland Kitchen Wash your hands often with soap and warm water for at least 20 seconds.  If soap and water are not readily available, use an alcohol-based hand sanitizer with at least 60% alcohol.  . If coughing or sneezing, cover your mouth and nose by coughing or sneezing into the elbow areas of your shirt or coat, into a tissue or into your sleeve (not your hands). Drinda Butts A MASK when in public  . Avoid shaking hands with others and consider head nods or verbal greetings only. . Avoid touching your eyes, nose, or mouth with unwashed hands.  . Avoid close contact with people who are sick. . Avoid places or events with large numbers of people in one location, like concerts or sporting events. . If you have some symptoms but not all symptoms, continue to monitor at home and seek medical attention if your symptoms worsen. . If you are having a medical emergency, call 911.   ADDITIONAL HEALTHCARE OPTIONS FOR PATIENTS  [facility] / e-Visit: [email or web address]         [facility]: [phone]  [facility]:  [phone]                   [facility]: [phone]     It is flu season:   >>> Best ways to protect herself from the flu: Receive the yearly flu vaccine, practice good hand hygiene washing with soap and also using hand sanitizer when available, eat a nutritious meals, get adequate rest, hydrate appropriately   Please contact the office if your symptoms worsen or you have concerns that you are not improving.   Thank you for choosing [facility] for your healthcare, and for allowing [place] to partner with you on your healthcare journey. I am thankful to be able to provide care to you today.   Elisha Headland FNP-C

## 2020-05-15 NOTE — Assessment & Plan Note (Signed)
Plan: Hycodan cough syrup prescribed today Emphasized to use sparingly as it can cause sedation Can continue to use Delsym We will schedule close follow-up with [name] on 05/16/2020

## 2020-05-16 ENCOUNTER — Encounter: Payer: Self-pay | Admitting: Internal Medicine

## 2020-05-16 ENCOUNTER — Other Ambulatory Visit: Payer: Self-pay | Admitting: Internal Medicine

## 2020-05-16 ENCOUNTER — Other Ambulatory Visit: Payer: Self-pay

## 2020-05-16 ENCOUNTER — Ambulatory Visit (HOSPITAL_COMMUNITY)
Admission: RE | Admit: 2020-05-16 | Discharge: 2020-05-16 | Disposition: A | Discharge status: Home / Self Care | Payer: 59 | Source: Ambulatory Visit | Attending: Internal Medicine | Admitting: Internal Medicine

## 2020-05-16 ENCOUNTER — Ambulatory Visit: Payer: 59 | Admitting: Internal Medicine

## 2020-05-16 VITALS — BP 140/82 | HR 81 | Temp 97.9°F | Ht 65.5 in | Wt 182.5 lb

## 2020-05-16 DIAGNOSIS — R058 Other specified cough: Secondary | ICD-10-CM

## 2020-05-16 DIAGNOSIS — J45991 Cough variant asthma: Secondary | ICD-10-CM

## 2020-05-16 DIAGNOSIS — R05 Cough: Secondary | ICD-10-CM

## 2020-05-16 MED ORDER — TRAMADOL HCL 50 MG PO TABS: 50.0000 mg | ORAL_TABLET | ORAL | 0 refills | Status: AC | PRN

## 2020-05-16 MED ORDER — BUDESONIDE-FORMOTEROL FUMARATE 80-4.5 MCG/ACT IN AERO: INHALATION_SPRAY | RESPIRATORY_TRACT | 11 refills | Status: DC

## 2020-05-16 MED ORDER — PREDNISONE 10 MG PO TABS: ORAL_TABLET | ORAL | 0 refills | Status: DC

## 2020-05-16 MED FILL — SYMBICORT 80-4.5 MCG INH: 80-4.5 | 30 days supply | Qty: 10 | Fill #0

## 2020-05-16 NOTE — Progress Notes (Signed)
Subjective:    Patient ID: Sara Hamilton, female   DOB: [birth year]    MRN: [id number]   Brief patient profile:   [id number] RT works at  [id number]    never smoker with onset as teenager  seasonal rhinitis mostly in spring never assoc with cough/ wheeze or difficulty with athletic endeavors and actually improved over time since HS and nl IUP most recent 2012 then acutely ill with URI late Dec 2018 =  High fever, productive cough/ body aches with resolution w/in 24 h but persistent mostly  Cough was productive thick yellow  and later in Jan 2019 e visit > abx > turned dry persisted > eval in February rx steroid shot/ prednisone improved 50-75% but also took cough suppression > April  Second round of pred 50-75% > 04/23/18 started saba ? Better and  04/28/18 eval by [name] 04/28/18  and allergy to grass/mold/guinea pig/dust weed rec symb/saba no better yet (Dr [name] got the idea she was better p saba but she denies) and rx also with singulair which did not start  so self referred to pulmonary clinic 04/30/2018     History of Present Illness  04/30/2018 1st [facility] office visit/ Sara Hamilton   Chief Complaint  Patient presents with  . Pulmonary Consult    Self referred- c/o cough since late Dec 2018. She states that cough is mainly non prod and tends to get worse at night.   cough 24/7 gets worse p supper and peak right when head head hits pillow  Voice use aggravates  Off allegra nose got stuffy / runs at night > all better when back on allegra  On same BCP x 6 years  Both ribs ant hurt from coughing  Not limited by breathing from desired activities rec  First take delsym two tsp every 12 hours and supplement if needed with  tramadol 50 mg up to 2 every 4 hours to suppress the urge to cough at all or even clear your throat.  Once you have eliminated the cough for 3 straight days try reducing the tramadol first,  then the delsym as tolerated.   Prednisone 10 mg take  4 each am x 2 days,   2 each am x  2 days,  1 each am x 2 days and stop (this is to eliminate allergies and inflammation from coughing) Protonix (pantoprazole) Take 30-60 min before first and last meal of the day and Pepcid 20 mg one bedtime plus Chlorpheniramine 4 mg x 2 at bedtime (both available over the counter)  until cough is completely gone for at least a week without the need for cough suppression GERD diet    7/19/200 NP Cough recurred p above rx so rec Continue Symbicort 2 puffs twice daily- daily asthma maintenance Continue Allegra, Singulair and nasacort - daily for allergy maintenance  Delsym or tessalon pearl- for cough   07/04/2019  f/u ov/Sara Hamilton 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 Follow-up    Cough has resolved. She has not had to use her proair.   Dyspnea:  Not limited by breathing from desired activities  But no aerobics / some laps in pool  Cough: none Sleeping: fine  SABA use: none  02: none rec Symbicort 160 Take 2 puffs first thing in am and then another 2 puffs about 12 hours later until 100% better for at least then ok to taper down and off Continue singulair and zyrtec  If  not doing better take the prednisone Prednisone 10 mg take  4 each am x 2 days,   2 each am x 2 days,  1 each am x 2 days and stop     12/20/2019 virtual Off symbicort since aug 2020 on singulair / xyzal/flonase maint and did not restart symbicort  Onset of drippy nose Dec 10 2019 with much worse cough and did e-vist 12/30 rec zpak and pred x 6,  pos covid 12/15/2019  12/17/19 severe fatigue , HA and weird smell /taste no fever chills/ swets  Severe dry harsh cough min white prod  Dyspnea: limited by fatigue than sob  rec    04/10/20 televisit rec Pantoprazole (protonix) 40 mg   Take  30-60 min before first meal of the day and Pepcid (famotidine)  20 mg one @  After supper  until return to office - this is the best way to tell whether stomach acid is contributing to your problem.    Prednisone 10 mg take  4 each am x 2 days,   2 each am x 2 days,  1 each am x 2 days and stop  Only use your albuterol as a rescue medication Take delsym two tsp every 12 hours and supplement if needed with  tramadol 50 mg up to 2 every 4 hours to suppress the urge to cough. Swallowing water and/or using ice chips/non mint and menthol containing candies (such as lifesavers or sugarless jolly ranchers) are also effective.  You should rest your voice and avoid activities that you know make you cough. Once you have eliminated the cough for 3 straight days try reducing the tramadol first,  then the delsym as tolerated.      05/16/2020  Acute  ov/Sara Hamilton re: recurrent cyclical cough since 2018 while on bcps Chief Complaint  Patient presents with  . Acute Visit    Increased cough for approx 1 wk- non prod and esp worse at night. She states that at night she can hear "wheezing in my throat".     Dyspnea: mostly with cough Cough: dry harsh, severe esp at hs did not respond to hycodan cough syrup SABA use: thinks it helps the cough somewhat  02: none   No obvious other pattern sin  day to day or daytime variability or assoc excess/ purulent sputum or mucus plugs or hemoptysis or cp or chest tightness, subjective wheeze or overt sinus or hb symptoms.    . Also denies any obvious fluctuation of symptoms with weather or environmental changes or other aggravating or alleviating factors except as outlined above   No unusual exposure hx or h/o childhood pna/ asthma or knowledge of premature birth.  Current Allergies, Complete Past Medical History, Past Surgical History, Family History, and Social History were reviewed in [employer] record.  ROS  The following are not active complaints unless bolded Hoarseness, sore throat/globus sensation , dysphagia, dental problems, itching, sneezing,  nasal congestion or discharge of excess mucus or purulent secretions, ear ache,   fever, chills,  sweats, unintended wt loss or wt gain, classically pleuritic or exertional cp,  orthopnea pnd or arm/hand swelling  or leg swelling, presyncope, palpitations, abdominal pain, anorexia, nausea, vomiting, diarrhea  or change in bowel habits or change in bladder habits, change in stools or change in urine, dysuria, hematuria,  rash, arthralgias, visual complaints, headache, numbness, weakness or ataxia or problems with walking or coordination,  change in mood or  memory.  Current Meds  Medication Sig  . albuterol (VENTOLIN HFA) 108 (90 Base) MCG/ACT inhaler Inhale 2 puffs into the lungs every 6 (six) hours as needed for wheezing or shortness of breath.  . famotidine (PEPCID) 20 MG tablet One after supper  . fluticasone (FLONASE) 50 MCG/ACT nasal spray Place 2 sprays into both nostrils at bedtime.   Marland Kitchen HYDROcodone-homatropine (HYCODAN) 5-1.5 MG/5ML syrup Take 5 mLs by mouth every 6 (six) hours as needed for cough.  . levocetirizine (XYZAL) 5 MG tablet Take 5 mg by mouth every evening.  . montelukast (SINGULAIR) 10 MG tablet TAKE 1 TABLET BY MOUTH AT BEDTIME.  . norethindrone-ethinyl estradiol-iron (BLISOVI FE 1.5/30) 1.5-30 MG-MCG tablet Take 1 tablet by mouth daily.  . pantoprazole (PROTONIX) 40 MG tablet TAKE 1 TABLET BY MOUTH DAILY 30 TO 60 MINUTES BEFORE THE FIRST MEAL OF THE DAY                   Current Meds  Medication Sig  . albuterol (PROAIR HFA) 108 (90 Base) MCG/ACT inhaler Inhale 2 puffs into the lungs every 4 (four) hours as needed for wheezing or shortness of breath.  . Cetirizine HCl (ZYRTEC PO) Take by mouth daily.  . fluticasone (FLONASE) 50 MCG/ACT nasal spray Place 2 sprays into both nostrils at bedtime.   . montelukast (SINGULAIR) 10 MG tablet TAKE 1 TABLET BY MOUTH AT BEDTIME.  . norethindrone-ethinyl estradiol-iron (BLISOVI FE 1.5/30) 1.5-30 MG-MCG tablet Take 1 tablet by mouth daily.  . SYMBICORT 160-4.5 MCG/ACT inhaler Inhale 2 puffs into the lungs 2 (two) times  daily.               Objective:   Physical Exam     05/16/2020         182  07/04/2019       183   04/30/18 176 lb (79.8 kg)  12/16/17 165 lb 3.2 oz (74.9 kg)  11/30/17 163 lb (73.9 kg)    amb wf nad / minimal dry cough   Vital signs reviewed  05/16/2020  - Note at rest 02 sats  99% on RA      HEENT : pt wearing mask not removed for exam due to covid -19 concerns.    NECK :  without JVD/Nodes/TM/ nl carotid upstrokes bilaterally   LUNGS: no acc muscle use,  Nl contour chest which is clear to A and P bilaterally without cough on insp or exp maneuvers   CV:  RRR  no s3 or murmur or increase in P2, and no edema   ABD:  soft and nontender with nl inspiratory excursion in the supine position. No bruits or organomegaly appreciated, bowel sounds nl  MS:  Nl gait/ ext warm without deformities, calf tenderness, cyanosis or clubbing No obvious joint restrictions   SKIN: warm and dry without lesions    NEURO:  alert, approp, nl sensorium with  no motor or cerebellar deficits apparent.          Assessment:

## 2020-05-16 NOTE — Assessment & Plan Note (Signed)
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 Increased density of secretions may reflect inspissation with superimposed fungal colonization of excluded.     Of the three most common causes of  Sub-acute / recurrent or chronic cough, only one (GERD)  can actually contribute to/ trigger  the other two (asthma and post nasal drip syndrome)  and perpetuate the cylce of cough.  While not intuitively obvious, many patients with chronic low grade reflux do not cough until there is a primary insult that disturbs the protective epithelial barrier and exposes sensitive nerve endings.   This is typically viral but can due to PNDS and  either may apply here.   The point is that once this occurs, it is difficult to eliminate the cycle  using anything but a maximally effective acid suppression regimen at least in the short run, accompanied by an appropriate diet to address non acid GERD and control / eliminate the cough itself for at least 3 days with tramadol and also  added 6 days of Prednisone in case of component of Th-2 driven upper or lower airways inflammation (if cough responds short term only to relapse befor return while will on rx for uacs that would point to allergic rhinitis/ asthma or eos bronchitis)  Plus eliminate pnds with 1st gen H1 blockers per guidelines    >>> f/u in 6 weeks, sooner if needed         Each maintenance medication was reviewed in detail including emphasizing most importantly the difference between maintenance and prns and under what circumstances the prns are to be triggered using an action plan  format where appropriate.  Total time for H and P, chart review, counseling, reviewing hfa device and generating customized AVS unique to this office visit / charting = 30 min

## 2020-05-16 NOTE — Patient Instructions (Addendum)
Symbicort 80 Take 2 puffs first thing in am and then another 2 puffs about 12 hours later until 100% better for a week without the need for any cough medicine then taper off and restart immediately    The key to effective treatment for your cough is eliminating the non-stop cycle of cough you're stuck in long enough to let your airway heal completely and then see if there is anything still making you cough once you stop the cough suppression, but this should take no more than 5 days to figure out  First take delsym two tsp every 12 hours and supplement if needed with  tramadol 50 mg up to 2 every 4 hours to suppress the urge to cough at all or even clear your throat. Swallowing water or using ice chips/non mint and menthol containing candies (such as lifesavers or sugarless jolly ranchers) are also effective.  You should rest your voice and avoid activities that you know make you cough.  Once you have eliminated the cough for 3 straight days try reducing the tramadol first,  then the delsym as tolerated.    Prednisone 10 mg take  4 each am x 2 days,   2 each am x 2 days,  1 each am x 2 days and stop (this is to eliminate allergies and inflammation from coughing)  Continue Protonix (pantoprazole) Take 30-60 min before first meal of the day and Pepcid 20 mg one hour  bedtime plus chlorpheniramine 4 mg x 2 at bedtime (both available over the counter)  until cough is completely gone for at least a week without the need for cough suppression  GERD (REFLUX)  is an extremely common cause of respiratory symptoms, many times with no significant heartburn at all.    It can be treated with medication, but also with lifestyle changes including avoidance of late meals, excessive alcohol, smoking cessation, and avoid fatty foods, chocolate, peppermint, colas, red wine, and acidic juices such as orange juice.  NO MINT OR MENTHOL PRODUCTS SO NO COUGH DROPS   USE HARD CANDY INSTEAD (jolley ranchers or Stover's or  Lifesavers (all available in sugarless versions) NO OIL BASED VITAMINS - use powdered substitutes.  Strongly consider alternative birth control method as it may well be contributing to your cough    Please schedule a follow up office visit in 6 weeks, call sooner if needed

## 2020-05-16 NOTE — Assessment & Plan Note (Addendum)
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 so rec d/c symbicort  And rx as cyclical cough  - FENO  07/02/18 = 97  - 06/2018 flaired off symb 160 while on singulair so resumed symb 160 2bid - 07/04/2019  After extensive coaching inhaler device,  effectiveness =    90% so rec cont symb 160 2 bid prn  - PFT's  04/10/2020  FEV1 2.96 (91 % ) ratio 0.81  p 13 % improvement from saba p no rx  prior to study   FV curve slt concave but normalizes after saba  And pt feels symbicort 80 helps so resumed 05/16/2020 "prn"  The problem with higher and more consistent symbicort here is aggravating the lower airway so ok to use symb 80 prn Based on two studies from NEJM  378; 20 p 1865 (2018) and 380 : p2020-30 (2019) in pts with mild asthma it is reasonable to use low dose symbicort eg 80 2bid "prn" flare in this setting but I emphasized this was only shown with symbicort and takes advantage of the rapid onset of action but is not the same as "rescue therapy" but can be stopped once the acute symptoms have resolved and the need for rescue has been minimized (< 2 x weekly)

## 2020-05-17 DIAGNOSIS — J338 Other polyp of sinus: Secondary | ICD-10-CM | POA: Diagnosis not present

## 2020-05-17 DIAGNOSIS — J324 Chronic pansinusitis: Secondary | ICD-10-CM | POA: Diagnosis not present

## 2020-05-17 NOTE — Progress Notes (Signed)
Spoke with pt and notified of results per Dr. Wert. Pt verbalized understanding and denied any questions. 

## 2020-06-08 DIAGNOSIS — J338 Other polyp of sinus: Secondary | ICD-10-CM | POA: Diagnosis not present

## 2020-06-08 DIAGNOSIS — J324 Chronic pansinusitis: Secondary | ICD-10-CM | POA: Diagnosis not present

## 2020-06-08 MED FILL — MONTELUKAST SOD 10 MG TAB: 10 | 30 days supply | Qty: 30 | Fill #1

## 2020-06-08 MED FILL — FAMOTIDINE 20 MG TABS: 20 | 30 days supply | Qty: 30 | Fill #3

## 2020-06-08 MED FILL — PANTOPRAZOLE SOD DR 40 MG T: 40 | 30 days supply | Qty: 30 | Fill #1

## 2020-06-28 MED FILL — SYMBICORT 80-4.5 MCG INH: 80-4.5 | 30 days supply | Qty: 10 | Fill #1

## 2020-07-02 MED FILL — MONTELUKAST SOD 10 MG TAB: 10 | 30 days supply | Qty: 30 | Fill #2

## 2020-07-03 ENCOUNTER — Ambulatory Visit: Payer: 59 | Admitting: Internal Medicine

## 2020-07-09 ENCOUNTER — Encounter: Payer: Self-pay | Admitting: Internal Medicine

## 2020-07-09 ENCOUNTER — Ambulatory Visit: Payer: 59 | Admitting: Internal Medicine

## 2020-07-09 ENCOUNTER — Other Ambulatory Visit: Payer: Self-pay

## 2020-07-09 DIAGNOSIS — R05 Cough: Secondary | ICD-10-CM

## 2020-07-09 DIAGNOSIS — R058 Other specified cough: Secondary | ICD-10-CM

## 2020-07-09 DIAGNOSIS — J45991 Cough variant asthma: Secondary | ICD-10-CM

## 2020-07-09 NOTE — Patient Instructions (Addendum)
After a couple of weeks of just using the  pepcid at bedtime ok to stop it.   A couple of weeks after that try just symbicort 80  1 puff at bedtime for a couple of weeks then ok to stop - if flare  immediately start back on symbicort 80 Take 2 puffs first thing in am and then another 2 puffs about 12 hours later.   If polyps start to come back Dupixent is an option.   I very strongly recommend you get the moderna or pfizer vaccine as soon as possible based on your risk of dying from the virus  and the proven safety and benefit of these vaccines against even the delta variant.  This can save your life as well as  those of your loved ones,  especially if they are also not vaccinated.

## 2020-07-09 NOTE — Assessment & Plan Note (Signed)
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 so rec d/c symbicort  And rx as cyclical cough  - FENO  07/02/18 = 97 - 06/2018 flaired off symb 160 while on singulair so resumed symb 160 2bid - 07/04/2019  After extensive coaching inhaler device,  effectiveness =    90% so rec cont symb 160 2 bid prn  - PFT's  04/10/2020  FEV1 2.96 (91 % ) ratio 0.81  p 13 % improvement from saba p no rx  prior to study   FV curve slt concave but normalizes after saba  And pt feels symbicort 80 helps so resumed 05/16/2020 "prn"  Ok to just use symb prn Based on two studies from NEJM  378; 20 p 1865 (2018) and 380 : p2020-30 (2019) in pts with mild asthma it is reasonable to use low dose symbicort eg 80 2bid "prn" flare in this setting but I emphasized this was only shown with symbicort and takes advantage of the rapid onset of action but is not the same as "rescue therapy" but can be stopped once the acute symptoms have resolved and the need for rescue has been minimized (< 2 x weekly)    >>> f/u prn

## 2020-07-09 NOTE — Progress Notes (Signed)
Subjective:    Patient ID: Sara Hamilton, female   DOB: 14-Oct-1986    MRN: [id number]   Brief patient profile:   [id number] RT works at  [employer]  never smoker with onset as teenager  seasonal rhinitis mostly in spring never assoc with cough/ wheeze or difficulty with athletic endeavors and actually improved over time since HS and nl IUP most recent 2012 then acutely ill with URI late Dec 2018 =  High fever, productive cough/ body aches with resolution w/in 24 h but persistent mostly  Cough was productive thick yellow  and later in Jan 2019 e visit > abx > turned dry persisted > eval in February rx steroid shot/ prednisone improved 50-75% but also took cough suppression > April  Second round of pred 50-75% > 04/23/18 started saba ? Better and  04/28/18 eval by [name] 04/28/18  and allergy to grass/mold/guinea pig/dust weed rec symb/saba no better yet (Dr [name] got the idea she was better p saba but she denies) and rx also with singulair which did not start  so self referred to pulmonary clinic 04/30/2018     History of Present Illness  04/30/2018 1st [facility] office visit/ Tylen Leverich   Chief Complaint  Patient presents with  . Pulmonary Consult    Self referred- c/o cough since late Dec 2018. She states that cough is mainly non prod and tends to get worse at night.   cough 24/7 gets worse p supper and peak right when head head hits pillow  Voice use aggravates  Off allegra nose got stuffy / runs at night > all better when back on allegra  On same BCP x 6 years  Both ribs ant hurt from coughing  Not limited by breathing from desired activities rec  First take delsym two tsp every 12 hours and supplement if needed with  tramadol 50 mg up to 2 every 4 hours to suppress the urge to cough at all or even clear your throat.  Once you have eliminated the cough for 3 straight days try reducing the tramadol first,  then the delsym as tolerated.   Prednisone 10 mg take  4 each am x 2 days,   2 each am x 2  days,  1 each am x 2 days and stop (this is to eliminate allergies and inflammation from coughing) Protonix (pantoprazole) Take 30-60 min before first and last meal of the day and Pepcid 20 mg one bedtime plus Chlorpheniramine 4 mg x 2 at bedtime (both available over the counter)  until cough is completely gone for at least a week without the need for cough suppression GERD diet    7/19/200 NP Cough recurred p above rx so rec Continue Symbicort 2 puffs twice daily- daily asthma maintenance Continue Allegra, Singulair and nasacort - daily for allergy maintenance  Delsym or tessalon pearl- for cough   07/04/2019  f/u ov/Elimelech Houseman 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 Follow-up    Cough has resolved. She has not had to use her proair.   Dyspnea:  Not limited by breathing from desired activities  But no aerobics / some laps in pool  Cough: none Sleeping: fine  SABA use: none  02: none rec Symbicort 160 Take 2 puffs first thing in am and then another 2 puffs about 12 hours later until 100% better for at least then ok to taper down and off Continue singulair and zyrtec  If not doing  better take the prednisone Prednisone 10 mg take  4 each am x 2 days,   2 each am x 2 days,  1 each am x 2 days and stop     12/20/2019 virtual Off symbicort since aug 2020 on singulair / xyzal/flonase maint and did not restart symbicort  Onset of drippy nose Dec 10 2019 with much worse cough and did e-vist 12/30 rec zpak and pred x 6,  pos covid 12/15/2019  12/17/19 severe fatigue , HA and weird smell /taste no fever chills/ swets  Severe dry harsh cough min white prod  Dyspnea: limited by fatigue than sob  rec    04/10/20 televisit rec Pantoprazole (protonix) 40 mg   Take  30-60 min before first meal of the day and Pepcid (famotidine)  20 mg one @  After supper  until return to office - this is the best way to tell whether stomach acid is contributing to your problem.    Prednisone 10 mg take  4 each am x 2 days,   2 each am x 2 days,  1 each am x 2 days and stop  Only use your albuterol as a rescue medication Take delsym two tsp every 12 hours and supplement if needed with  tramadol 50 mg up to 2 every 4 hours to suppress the urge to cough. Swallowing water and/or using ice chips/non mint and menthol containing candies (such as lifesavers or sugarless jolly ranchers) are also effective.  You should rest your voice and avoid activities that you know make you cough. Once you have eliminated the cough for 3 straight days try reducing the tramadol first,  then the delsym as tolerated.     Sinus surgery May 17 2021Teoh / polyps    05/16/2020  Acute  ov/Margrete Delude re: recurrent cyclical cough since 2018 while on bcps Chief Complaint  Patient presents with  . Acute Visit    Increased cough for approx 1 wk- non prod and esp worse at night. She states that at night she can hear "wheezing in my throat".     Dyspnea: mostly with cough Cough: dry harsh, severe esp at hs did not respond to hycodan cough syrup SABA use: thinks it helps the cough somewhat  02: none rec Symbicort 80 Take 2 puffs first thing in am and then another 2 puffs about 12 hours later until 100% better for a week without the need for any cough medicine then taper off and restart immediately   The key to effective treatment for your cough is eliminating the non-stop cycle of cough you're stuck in long enough to let your airway heal completely and then see if there is anything still making you cough once you stop the cough suppression, but this should take no more than 5 days to figure out First take delsym two tsp every 12 hours and supplement if needed with  tramadol 50 mg up to 2 every 4 hours . Once you have eliminated the cough for 3 straight days try reducing the tramadol first,  then the delsym as tolerated.   Prednisone 10 mg take  4 each am x 2 days,   2 each am x 2 days,  1 each am x 2 days and  stop (this is to eliminate allergies and inflammation from coughing) Continue Protonix (pantoprazole) Take 30-60 min before first meal of the day and Pepcid 20 mg one hour  bedtime plus chlorpheniramine 4 mg x 2 at bedtime (both available over the  counter)  until cough is completely gone for at least a week without the need for cough suppression GERD diet   Strongly consider alternative birth control method as it may well be contributing to your cough  Please schedule a follow up office visit in 6 weeks, call sooner if needed   / d/c bcps  05/16/20    07/09/2020  f/u ov/[place] office/Yamilett Anastos re:  symbicort 80 2 puff  Qh only / singulair, hs pepcid only /not yet vaccinated but plans to soon  Chief Complaint  Patient presents with  . Follow-up    Cough has resolved and no new co'.s   Dyspnea:  Walking neighborhood  Cough: none/ no need for cough meds /still on xyzal hs  Sleeping: fine/ pepcid at hs no ppi  SABA use: none 02:  None    No obvious day to day or daytime variability or assoc excess/ purulent sputum or mucus plugs or hemoptysis or cp or chest tightness, subjective wheeze or overt sinus or hb symptoms.   Sleeping  without nocturnal  or early am exacerbation  of respiratory  c/o's or need for noct saba. Also denies any obvious fluctuation of symptoms with weather or environmental changes or other aggravating or alleviating factors except as outlined above   No unusual exposure hx or h/o childhood pna/ asthma or knowledge of premature birth.  Current Allergies, Complete Past Medical History, Past Surgical History, Family History, and Social History were reviewed in [facility] electronic medical record.  ROS  The following are not active complaints unless bolded Hoarseness, sore throat, dysphagia, dental problems, itching, sneezing,  nasal congestion or discharge of excess mucus or purulent secretions, ear ache,   fever, chills, sweats, unintended wt loss or wt gain, classically  pleuritic or exertional cp,  orthopnea pnd or arm/hand swelling  or leg swelling, presyncope, palpitations, abdominal pain, anorexia, nausea, vomiting, diarrhea  or change in bowel habits or change in bladder habits, change in stools or change in urine, dysuria, hematuria,  rash, arthralgias, visual complaints, headache, numbness, weakness or ataxia or problems with walking or coordination,  change in mood or  memory.        Current Meds  Medication Sig  . albuterol (VENTOLIN HFA) 108 (90 Base) MCG/ACT inhaler Inhale 2 puffs into the lungs every 6 (six) hours as needed for wheezing or shortness of breath.  . budesonide-formoterol (SYMBICORT) 80-4.5 MCG/ACT inhaler Take 2 puffs first thing in am and then another 2 puffs about 12 hours later.  . famotidine (PEPCID) 20 MG tablet One after supper  . fluticasone (FLONASE) 50 MCG/ACT nasal spray Place 2 sprays into both nostrils at bedtime.   Marland Kitchen. levocetirizine (XYZAL) 5 MG tablet Take 5 mg by mouth every evening.  . montelukast (SINGULAIR) 10 MG tablet TAKE 1 TABLET BY MOUTH AT BEDTIME.  . pantoprazole (PROTONIX) 40 MG tablet TAKE 1 TABLET BY MOUTH DAILY 30 TO 60 MINUTES BEFORE THE FIRST MEAL OF THE DAY               Objective:   Physical Exam    07/09/2020      182 05/16/2020         182  07/04/2019       183   04/30/18 176 lb (79.8 kg)  12/16/17 165 lb 3.2 oz (74.9 kg)  11/30/17 163 lb (73.9 kg)    Vital signs reviewed  07/09/2020  - Note at rest 02 sats  97% on RA  HEENT : pt wearing mask not removed for exam due to covid -19 concerns.    NECK :  without JVD/Nodes/TM/ nl carotid upstrokes bilaterally   LUNGS: no acc muscle use,  Nl contour chest which is clear to A and P bilaterally without cough on insp or exp maneuvers   CV:  RRR  no s3 or murmur or increase in P2, and no edema   ABD:  soft and nontender with nl inspiratory excursion in the supine position. No bruits or organomegaly appreciated, bowel sounds nl  MS:  Nl gait/  ext warm without deformities, calf tenderness, cyanosis or clubbing No obvious joint restrictions   SKIN: warm and dry without lesions    NEURO:  alert, approp, nl sensorium with  no motor or cerebellar deficits apparent.            Assessment:

## 2020-07-09 NOTE — Assessment & Plan Note (Addendum)
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 Increased density of secretions may reflect inspissation with superimposed fungal colonization of excluded. -Sinus surgery Apr 30 2020  Teoh / polyps - 05/16/20 d/c bcps > cough resolved   Resolved / likely multifactorial so ok to taper off gerd rx first then symbicort (see sep a/p) Maint on just singulair/ xyzal, but if flares consider dupixent  Pt informed of the seriousness of COVID 19 infection as a direct risk to lung health  and safey and to close contacts and should continue to wear a facemask in public and minimize exposure to public locations but especially avoid any area or activity where non-close contacts are not observing distancing or wearing an appropriate face mask.  I strongly recommended she take either of the vaccines available through local drugstores based on updated information on millions of Americans treated with the Moderna and [employer] products  which have proven both safe and  effective even against the new delta variant.

## 2020-07-10 ENCOUNTER — Ambulatory Visit: Payer: 59 | Admitting: Gastroenterology

## 2020-07-18 DIAGNOSIS — J069 Acute upper respiratory infection, unspecified: Secondary | ICD-10-CM | POA: Diagnosis not present

## 2020-07-30 MED FILL — MONTELUKAST SOD 10 MG TAB: 10 | 30 days supply | Qty: 30 | Fill #3

## 2020-09-04 MED FILL — MONTELUKAST SOD 10 MG TAB: 10 | 30 days supply | Qty: 30 | Fill #4

## 2020-10-01 MED FILL — MONTELUKAST SOD 10 MG TAB: 10 | 30 days supply | Qty: 30 | Fill #5

## 2020-11-01 ENCOUNTER — Other Ambulatory Visit: Payer: Self-pay | Admitting: Internal Medicine

## 2020-11-01 MED FILL — MONTELUKAST SOD 10 MG TAB: 10 | 30 days supply | Qty: 30 | Fill #0

## 2020-11-26 MED FILL — MONTELUKAST SOD 10 MG TAB: 10 | 30 days supply | Qty: 30 | Fill #1

## 2020-11-29 MED FILL — SYMBICORT 80-4.5 MCG INH: 80-4.5 | 30 days supply | Qty: 10 | Fill #2

## 2020-12-05 ENCOUNTER — Other Ambulatory Visit (HOSPITAL_COMMUNITY): Payer: Self-pay | Admitting: Otolaryngology

## 2020-12-05 DIAGNOSIS — R430 Anosmia: Secondary | ICD-10-CM | POA: Diagnosis not present

## 2020-12-05 DIAGNOSIS — J338 Other polyp of sinus: Secondary | ICD-10-CM | POA: Diagnosis not present

## 2020-12-05 DIAGNOSIS — J324 Chronic pansinusitis: Secondary | ICD-10-CM | POA: Diagnosis not present

## 2020-12-05 MED FILL — predniSONE 10 MG TABS: 10 | 12 days supply | Qty: 48 | Fill #0

## 2021-01-03 MED FILL — MONTELUKAST SOD 10 MG TAB: 10 | 30 days supply | Qty: 30 | Fill #2

## 2021-01-16 MED FILL — SYMBICORT 80-4.5 MCG INH: 80-4.5 | 30 days supply | Qty: 10 | Fill #3

## 2021-02-04 MED FILL — MONTELUKAST SOD 10 MG TAB: 10 | 30 days supply | Qty: 30 | Fill #3

## 2021-02-14 DIAGNOSIS — E782 Mixed hyperlipidemia: Secondary | ICD-10-CM | POA: Diagnosis not present

## 2021-02-14 DIAGNOSIS — J329 Chronic sinusitis, unspecified: Secondary | ICD-10-CM | POA: Diagnosis not present

## 2021-02-14 DIAGNOSIS — Z0189 Encounter for other specified special examinations: Secondary | ICD-10-CM | POA: Diagnosis not present

## 2021-02-14 DIAGNOSIS — K219 Gastro-esophageal reflux disease without esophagitis: Secondary | ICD-10-CM | POA: Diagnosis not present

## 2021-02-14 DIAGNOSIS — Z6830 Body mass index (BMI) 30.0-30.9, adult: Secondary | ICD-10-CM | POA: Diagnosis not present

## 2021-02-14 DIAGNOSIS — E669 Obesity, unspecified: Secondary | ICD-10-CM | POA: Diagnosis not present

## 2021-02-14 DIAGNOSIS — J302 Other seasonal allergic rhinitis: Secondary | ICD-10-CM | POA: Diagnosis not present

## 2021-02-14 DIAGNOSIS — Z0001 Encounter for general adult medical examination with abnormal findings: Secondary | ICD-10-CM | POA: Diagnosis not present

## 2021-02-14 DIAGNOSIS — J4540 Moderate persistent asthma, uncomplicated: Secondary | ICD-10-CM | POA: Diagnosis not present

## 2021-02-19 DIAGNOSIS — J329 Chronic sinusitis, unspecified: Secondary | ICD-10-CM | POA: Diagnosis not present

## 2021-02-19 DIAGNOSIS — E782 Mixed hyperlipidemia: Secondary | ICD-10-CM | POA: Diagnosis not present

## 2021-02-19 DIAGNOSIS — J4540 Moderate persistent asthma, uncomplicated: Secondary | ICD-10-CM | POA: Diagnosis not present

## 2021-02-19 DIAGNOSIS — Z6831 Body mass index (BMI) 31.0-31.9, adult: Secondary | ICD-10-CM | POA: Diagnosis not present

## 2021-02-19 DIAGNOSIS — E669 Obesity, unspecified: Secondary | ICD-10-CM | POA: Diagnosis not present

## 2021-02-19 DIAGNOSIS — Z0001 Encounter for general adult medical examination with abnormal findings: Secondary | ICD-10-CM | POA: Diagnosis not present

## 2021-02-19 DIAGNOSIS — J302 Other seasonal allergic rhinitis: Secondary | ICD-10-CM | POA: Diagnosis not present

## 2021-02-19 DIAGNOSIS — K219 Gastro-esophageal reflux disease without esophagitis: Secondary | ICD-10-CM | POA: Diagnosis not present

## 2021-03-06 MED FILL — SYMBICORT 80-4.5 MCG INH: 80-4.5 | 30 days supply | Qty: 10 | Fill #4

## 2021-03-06 MED FILL — MONTELUKAST SOD 10 MG TAB: 10 | 30 days supply | Qty: 30 | Fill #4

## 2021-05-03 ENCOUNTER — Other Ambulatory Visit (HOSPITAL_COMMUNITY): Payer: Self-pay

## 2021-05-03 MED FILL — Montelukast Sodium Tab 10 MG (Base Equiv): ORAL | 30 days supply | Qty: 30 | Fill #0 | Status: AC

## 2021-05-06 ENCOUNTER — Other Ambulatory Visit (HOSPITAL_COMMUNITY): Payer: Self-pay

## 2021-05-06 ENCOUNTER — Other Ambulatory Visit: Payer: Self-pay | Admitting: Internal Medicine

## 2021-05-07 ENCOUNTER — Other Ambulatory Visit: Payer: Self-pay | Admitting: Internal Medicine

## 2021-05-07 ENCOUNTER — Telehealth: Payer: Self-pay | Admitting: Internal Medicine

## 2021-05-07 ENCOUNTER — Other Ambulatory Visit (HOSPITAL_COMMUNITY): Payer: Self-pay

## 2021-05-07 MED ORDER — BUDESONIDE-FORMOTEROL FUMARATE 80-4.5 MCG/ACT IN AERO
INHALATION_SPRAY | RESPIRATORY_TRACT | 2 refills | Status: DC
  Filled 2021-05-07: qty 10.2, 30d supply, fill #0

## 2021-05-07 NOTE — Telephone Encounter (Signed)
Please see message from pharmacy:   Insurance prefer Advair Diskus since it is $5 vs Symbicort for $71. Please consider changing since patient is requesting due to price.   Edyth Gunnels you like to send in Advair for this pt?

## 2021-05-07 NOTE — Telephone Encounter (Signed)
Will sign off on this message as there is another message that has been sent to the pharmacy for this.

## 2021-05-07 NOTE — Telephone Encounter (Signed)
Pt will need appt for further refills.  

## 2021-05-07 NOTE — Telephone Encounter (Signed)
Called and spoke to pt. Pt states her Symbicort is near $70 but was told by her pharmacist that advair would only be around $5. Pt is questioning what [name] recommends.   [name], please advise. Thanks.

## 2021-05-07 NOTE — Telephone Encounter (Signed)
Already responded on refill request

## 2021-05-07 NOTE — Telephone Encounter (Signed)
I'm ok with advair but dose should be the 45 hfa Take 2 puffs first thing in am and then another 2 puffs about 12 hours later (the equivalent of symbicort 80)   If the only thing they's pay for is the advair discus, then use the 100/50 one bid

## 2021-05-14 ENCOUNTER — Other Ambulatory Visit (HOSPITAL_COMMUNITY): Payer: Self-pay

## 2021-05-14 ENCOUNTER — Telehealth: Payer: Self-pay | Admitting: Internal Medicine

## 2021-05-14 ENCOUNTER — Other Ambulatory Visit: Payer: 59

## 2021-05-14 MED ORDER — ADVAIR HFA 45-21 MCG/ACT IN AERO
2.0000 | INHALATION_SPRAY | Freq: Two times a day (BID) | RESPIRATORY_TRACT | 11 refills | Status: DC
Start: 2021-05-14 — End: 2021-05-16
  Filled 2021-05-14: qty 12, 30d supply, fill #0

## 2021-05-14 NOTE — Telephone Encounter (Signed)
Per refill request from 05/07/21:  I'm ok with advair but dose should be the 45 hfa Take 2 puffs first thing in am and then another 2 puffs about 12 hours later (the equivalent of symbicort 80)   If the only thing they's pay for is the advair discus, then use the 100/50 one bid   Spoke with the pt  R for Adavir hfa sent  Nothing further needed

## 2021-05-16 ENCOUNTER — Other Ambulatory Visit (HOSPITAL_COMMUNITY): Payer: Self-pay

## 2021-05-16 ENCOUNTER — Telehealth: Payer: Self-pay | Admitting: Internal Medicine

## 2021-05-16 MED ORDER — FLUTICASONE-SALMETEROL 100-50 MCG/ACT IN AEPB
1.0000 | INHALATION_SPRAY | Freq: Two times a day (BID) | RESPIRATORY_TRACT | 11 refills | Status: DC
  Filled 2021-05-16: qty 60, 30d supply, fill #0
  Filled 2021-07-02: qty 60, 30d supply, fill #1
  Filled 2021-08-01: qty 60, 30d supply, fill #2

## 2021-05-16 MED ORDER — CARESTART COVID-19 HOME TEST VI KIT
PACK | 0 refills | Status: DC
  Filled 2021-05-16: qty 4, 4d supply, fill #0

## 2021-05-16 NOTE — Telephone Encounter (Signed)
Called and spoke with Patient.  Patient stated she was told insurance only covers Advair diskus. Per previous encounter [name] recommended Advair diskus 100/50 if Advair HFA not covered. Advair diskus sent to requested [facility] pharmacy.  Nothing further at this time.    Per refill request from 05/07/21 encounter-  I'm ok with advair but dose should be the 45 hfaTake 2 puffsfirst thing in amand then another 2 puffs about 12 hours later (the equivalent of symbicort 80)   If the only thing they's pay for is the advair discus, then use the 100/50 one bid

## 2021-05-31 ENCOUNTER — Other Ambulatory Visit: Payer: Self-pay | Admitting: Internal Medicine

## 2021-05-31 ENCOUNTER — Other Ambulatory Visit (HOSPITAL_COMMUNITY): Payer: Self-pay

## 2021-05-31 MED ORDER — MONTELUKAST SODIUM 10 MG PO TABS
10.0000 mg | ORAL_TABLET | Freq: Every day | ORAL | 0 refills | Status: DC
  Filled 2021-05-31: qty 30, 30d supply, fill #0

## 2021-07-02 ENCOUNTER — Other Ambulatory Visit: Payer: Self-pay | Admitting: Internal Medicine

## 2021-07-02 ENCOUNTER — Other Ambulatory Visit: Payer: 59 | Admitting: Adult Health

## 2021-07-02 ENCOUNTER — Other Ambulatory Visit (HOSPITAL_COMMUNITY): Payer: Self-pay

## 2021-07-02 MED ORDER — MONTELUKAST SODIUM 10 MG PO TABS
10.0000 mg | ORAL_TABLET | Freq: Every day | ORAL | 0 refills | Status: DC
  Filled 2021-07-02: qty 30, 30d supply, fill #0

## 2021-07-11 DIAGNOSIS — J338 Other polyp of sinus: Secondary | ICD-10-CM | POA: Diagnosis not present

## 2021-07-11 DIAGNOSIS — R430 Anosmia: Secondary | ICD-10-CM | POA: Diagnosis not present

## 2021-08-01 ENCOUNTER — Other Ambulatory Visit: Payer: Self-pay | Admitting: Internal Medicine

## 2021-08-02 ENCOUNTER — Other Ambulatory Visit (HOSPITAL_COMMUNITY): Payer: Self-pay

## 2021-08-02 MED ORDER — MONTELUKAST SODIUM 10 MG PO TABS
10.0000 mg | ORAL_TABLET | Freq: Every day | ORAL | 0 refills | Status: DC
  Filled 2021-08-02: qty 20, 20d supply, fill #0

## 2021-08-07 ENCOUNTER — Ambulatory Visit (INDEPENDENT_AMBULATORY_CARE_PROVIDER_SITE_OTHER): Payer: 59 | Admitting: Adult Health

## 2021-08-07 ENCOUNTER — Other Ambulatory Visit: Payer: Self-pay

## 2021-08-07 ENCOUNTER — Encounter: Payer: Self-pay | Admitting: Adult Health

## 2021-08-07 ENCOUNTER — Other Ambulatory Visit (HOSPITAL_COMMUNITY)
Admission: RE | Admit: 2021-08-07 | Discharge: 2021-08-07 | Disposition: A | Discharge status: Home / Self Care | Payer: 59 | Source: Ambulatory Visit | Attending: Adult Health | Admitting: Adult Health

## 2021-08-07 VITALS — BP 116/80 | HR 70 | Ht 65.5 in | Wt 189.0 lb

## 2021-08-07 DIAGNOSIS — Z01419 Encounter for gynecological examination (general) (routine) without abnormal findings: Secondary | ICD-10-CM | POA: Diagnosis not present

## 2021-08-07 NOTE — Progress Notes (Signed)
Patient ID: Sara Hamilton, female   DOB: [birth year], 35 y.o.   MRN: [id number] History of Present Illness: Sara Hamilton is a 35 year old white female, married, [id number], in for a well woman gyn exam and pap. She is nurse at [employer].  PCP is Dr Margo Aye.   Current Medications, Allergies, Past Medical History, Past Surgical History, Family History and Social History were reviewed in [employer] record.     Review of Systems: Patient denies any headaches, hearing loss, fatigue, blurred vision, shortness of breath, chest pain, abdominal pain, problems with bowel movements, urination, or intercourse. No joint pain or mood swings.     Physical Exam:BP 116/80 (BP Location: Left Arm, Patient Position: Sitting, Cuff Size: Normal)   Pulse 70   Ht 5' 5.5" (1.664 m)   Wt 189 lb (85.7 kg)   LMP 07/22/2021   BMI 30.97 kg/m   General:  Well developed, well nourished, no acute distress Skin:  Warm and dry Neck:  Midline trachea, normal thyroid, good ROM, no lymphadenopathy Lungs; Clear to auscultation bilaterally Breast:  No dominant palpable mass, retraction, or nipple discharge Cardiovascular: Regular rate and rhythm Abdomen:  Soft, non tender, no hepatosplenomegaly Pelvic:  External genitalia is normal in appearance, no lesions.  The vagina is normal in appearance. Urethra has no lesions or masses. The cervix is bulbous.Pap with HR HPV genotyping performed.  Uterus is felt to be normal size, shape, and contour.  No adnexal masses or tenderness noted.Bladder is non tender, no masses felt. Rectal: Deferred Extremities/musculoskeletal:  No swelling or varicosities noted, no clubbing or cyanosis Psych:  No mood changes, alert and cooperative,seems happy AA is 0  Fall risk is low Depression screen [facility] 2/9 08/07/2021 12/30/2018 11/30/2017  Decreased Interest 0 0 0  Down, Depressed, Hopeless 0 0 0  PHQ - 2 Score 0 0 0  Altered sleeping 0 - -  Tired, decreased energy 0 - -  Change in appetite 0  - -  Feeling bad or failure about yourself  0 - -  Trouble concentrating 0 - -  Moving slowly or fidgety/restless 0 - -  Suicidal thoughts 0 - -  PHQ-9 Score 0 - -    GAD 7 : Generalized Anxiety Score 08/07/2021  Nervous, Anxious, on Edge 0  Control/stop worrying 0  Worry too much - different things 0  Trouble relaxing 0  Restless 0  Easily annoyed or irritable 0  Afraid - awful might happen 0  Total GAD 7 Score 0      Upstream - 08/07/21 1329       Pregnancy Intention Screening   Does the patient want to become pregnant in the next year? No    Does the patient's partner want to become pregnant in the next year? No    Would the patient like to discuss contraceptive options today? No      Contraception Wrap Up   Current Method Vasectomy    End Method Vasectomy    Contraception Counseling Provided No            Examination chaperoned by Faith Rogue LPN  Impression and Plan: 1. Encounter for gynecological examination with Papanicolaou smear of cervix Pap sent Physical with PCP next year Pap  in 3 years if normal Labs with PCP

## 2021-08-08 LAB — CYTOLOGY - PAP
Comment: NEGATIVE
Diagnosis: NEGATIVE
High risk HPV: NEGATIVE

## 2021-08-21 ENCOUNTER — Other Ambulatory Visit (HOSPITAL_COMMUNITY): Payer: Self-pay

## 2021-08-21 ENCOUNTER — Telehealth: Payer: Self-pay | Admitting: Internal Medicine

## 2021-08-21 MED ORDER — MONTELUKAST SODIUM 10 MG PO TABS
10.0000 mg | ORAL_TABLET | Freq: Every day | ORAL | 0 refills | Status: DC
  Filled 2021-08-21: qty 30, 30d supply, fill #0

## 2021-08-21 NOTE — Telephone Encounter (Signed)
I have called the pt and she stated that she had an appt with MW on 09/08 but this had to be cancelled due to MW out of town.  I have sent in the refill for the singulair to her pharmacy.  She stated that they are going to call her back to reschedule her appt.  Nothing further is needed.

## 2021-08-22 ENCOUNTER — Ambulatory Visit: Payer: 59 | Admitting: Internal Medicine

## 2021-08-22 ENCOUNTER — Other Ambulatory Visit (HOSPITAL_COMMUNITY): Payer: Self-pay

## 2021-09-12 ENCOUNTER — Other Ambulatory Visit (HOSPITAL_COMMUNITY): Payer: Self-pay

## 2021-09-12 ENCOUNTER — Ambulatory Visit: Payer: 59 | Admitting: Internal Medicine

## 2021-09-12 ENCOUNTER — Encounter: Payer: Self-pay | Admitting: Internal Medicine

## 2021-09-12 ENCOUNTER — Other Ambulatory Visit: Payer: Self-pay

## 2021-09-12 DIAGNOSIS — R058 Other specified cough: Secondary | ICD-10-CM

## 2021-09-12 DIAGNOSIS — J45991 Cough variant asthma: Secondary | ICD-10-CM | POA: Diagnosis not present

## 2021-09-12 MED ORDER — FLUTICASONE-SALMETEROL 100-50 MCG/ACT IN AEPB
1.0000 | INHALATION_SPRAY | Freq: Two times a day (BID) | RESPIRATORY_TRACT | 11 refills | Status: DC
  Filled 2021-09-12: qty 60, 30d supply, fill #0
  Filled 2021-11-12: qty 60, 30d supply, fill #1
  Filled 2022-01-10: qty 60, 30d supply, fill #2
  Filled 2022-03-03: qty 60, 30d supply, fill #3
  Filled 2022-04-02: qty 60, 30d supply, fill #4

## 2021-09-12 NOTE — Assessment & Plan Note (Signed)
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 Increased density of secretions may reflect inspissation with superimposed fungal colonization of excluded. -Sinus surgery Apr 30 2020  Teoh / polyps - 05/16/20 d/c bcps > cough resolved  Sinus surgery May 17 2021Teoh / polyps  Cough now is likely due to advair more than sinus dz but hard to be sure.  Clearly the nasal obstruction assoc with the cough though is coming from the same process that has caused the polyps and requires multiple courses of prednisone since polyp surgery so good candidate for Dupixent if can get qualified for it.  >>> referred back to Dr [name] / f/u here yearly for refills on inhalers or can get these also from Dr Reece Agar         Each maintenance medication was reviewed in detail including emphasizing most importantly the difference between maintenance and prns and under what circumstances the prns are to be triggered using an action plan format where appropriate.  Total time for H and P, chart review, counseling, reviewing dpi/nasal device(s) and generating customized AVS unique to this office visit / same day charting = 

## 2021-09-12 NOTE — Assessment & Plan Note (Signed)
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 so rec d/c symbicort  And rx as cyclical cough  - FENO  07/02/18 = 97 - 06/2018 flaired off symb 160 while on singulair so resumed symb 160 2bid - 07/04/2019  After extensive coaching inhaler device,  effectiveness =    90% so rec cont symb 160 2 bid prn  - PFT's  04/10/2020  FEV1 2.96 (91 % ) ratio 0.81  p 13 % improvement from saba p no rx  prior to study   FV curve slt concave but normalizes after saba  And pt feels symbicort 80 helps so resumed 05/16/2020 "prn"  All goals of chronic asthma control met including optimal function and elimination of symptoms with minimal need for rescue therapy.  Contingencies discussed in full including contacting this office immediately if not controlling the symptoms using the rule of two's.

## 2021-09-12 NOTE — Patient Instructions (Signed)
You need to see Dr [name] about your nasal obstruction that responds so convincingly to prednisone because you are good candidate for Dupixent or one of the other biologics  Please schedule a follow up visit in 12 months but call sooner if needed in [place] office

## 2021-09-12 NOTE — Progress Notes (Signed)
Subjective:    Patient ID: Sara Hamilton, female   DOB: 14-Nov-1986    MRN: [id number]   Brief patient profile:   33 yowf RT works at  [employer]  never smoker with onset as teenager  seasonal rhinitis mostly in spring never assoc with cough/ wheeze or difficulty with athletic endeavors and actually improved over time since HS and nl IUP most recent 2012 then acutely ill with URI late Dec 2018 =  High fever, productive cough/ body aches with resolution w/in 24 h but persistent mostly  Cough was productive thick yellow  and later in Jan 2019 e visit > abx > turned dry persisted > eval in February rx steroid shot/ prednisone improved 50-75% but also took cough suppression > April  Second round of pred 50-75% > 04/23/18 started saba ? Better and  04/28/18 eval by [name] 04/28/18  and allergy to grass/mold/guinea pig/dust weed rec symb/saba no better yet (Dr [name] got the idea she was better p saba but she denies) and rx also with singulair which did not start  so self referred to pulmonary clinic 04/30/2018     History of Present Illness  04/30/2018 1st [facility] office visit/ Sara Hamilton   Chief Complaint  Patient presents with   Pulmonary Consult    Self referred- c/o cough since late Dec 2018. She states that cough is mainly non prod and tends to get worse at night.   cough 24/7 gets worse p supper and peak right when head head hits pillow  Voice use aggravates  Off allegra nose got stuffy / runs at night > all better when back on allegra  On same BCP x 6 years  Both ribs ant hurt from coughing  Not limited by breathing from desired activities rec  First take delsym two tsp every 12 hours and supplement if needed with  tramadol 50 mg up to 2 every 4 hours to suppress the urge to cough at all or even clear your throat.  Once you have eliminated the cough for 3 straight days try reducing the tramadol first,  then the delsym as tolerated.   Prednisone 10 mg take  4 each am x 2 days,   2 each am x 2  days,  1 each am x 2 days and stop (this is to eliminate allergies and inflammation from coughing) Protonix (pantoprazole) Take 30-60 min before first and last meal of the day and Pepcid 20 mg one bedtime plus Chlorpheniramine 4 mg x 2 at bedtime (both available over the counter)  until cough is completely gone for at least a week without the need for cough suppression GERD diet    7/19/200 NP Cough recurred p above rx so rec Continue Symbicort 2 puffs twice daily- daily asthma maintenance Continue Allegra, Singulair and nasacort - daily for allergy maintenance  Delsym or tessalon pearl- for cough   07/04/2019  f/u ov/Sara Hamilton re: cough variant asthma  Vs uacs  Chief Complaint  Patient presents with   Follow-up    Cough has resolved. She has not had to use her proair.   Dyspnea:  Not limited by breathing from desired activities  But no aerobics / some laps in pool  Cough: none Sleeping: fine  SABA use: none  02: none rec Symbicort 160 Take 2 puffs first thing in am and then another 2 puffs about 12 hours later until 100% better for at least then ok to taper down and off Continue singulair and zyrtec  If not  doing better take the prednisone Prednisone 10 mg take  4 each am x 2 days,   2 each am x 2 days,  1 each am x 2 days and stop     12/20/2019 virtual Off symbicort since aug 2020 on singulair / xyzal/flonase maint and did not restart symbicort  Onset of drippy nose Dec 10 2019 with much worse cough and did e-vist 12/30 rec zpak and pred x 6,  pos covid 12/15/2019  12/17/19 severe fatigue , HA and weird smell /taste no fever chills/ swets  Severe dry harsh cough min white prod  Dyspnea: limited by fatigue than sob  rec    04/10/20 televisit rec Pantoprazole (protonix) 40 mg   Take  30-60 min before first meal of the day and Pepcid (famotidine)  20 mg one @  After supper  until return to office - this is the best way to tell whether stomach acid is contributing to your problem.    Prednisone 10 mg take  4 each am x 2 days,   2 each am x 2 days,  1 each am x 2 days and stop  Only use your albuterol as a rescue medication Take delsym two tsp every 12 hours and supplement if needed with  tramadol 50 mg up to 2 every 4 hours to suppress the urge to cough. Swallowing water and/or using ice chips/non mint and menthol containing candies (such as lifesavers or sugarless jolly ranchers) are also effective.  You should rest your voice and avoid activities that you know make you cough. Once you have eliminated the cough for 3 straight days try reducing the tramadol first,  then the delsym as tolerated.     Sinus surgery May 17 2021Teoh / polyps    05/16/2020  Acute  ov/Sara Hamilton re: recurrent cyclical cough since 2018 while on bcps Chief Complaint  Patient presents with   Acute Visit    Increased cough for approx 1 wk- non prod and esp worse at night. She states that at night she can hear "wheezing in my throat".     Dyspnea: mostly with cough Cough: dry harsh, severe esp at hs did not respond to hycodan cough syrup SABA use: thinks it helps the cough somewhat  02: none rec Symbicort 80 Take 2 puffs first thing in am and then another 2 puffs about 12 hours later until 100% better for a week without the need for any cough medicine then taper off and restart immediately   The key to effective treatment for your cough is eliminating the non-stop cycle of cough you're stuck in long enough to let your airway heal completely and then see if there is anything still making you cough once you stop the cough suppression, but this should take no more than 5 days to figure out First take delsym two tsp every 12 hours and supplement if needed with  tramadol 50 mg up to 2 every 4 hours . Once you have eliminated the cough for 3 straight days try reducing the tramadol first,  then the delsym as tolerated.   Prednisone 10 mg take  4 each am x 2 days,   2 each am x 2 days,  1 each am x 2 days and stop  (this is to eliminate allergies and inflammation from coughing) Continue Protonix (pantoprazole) Take 30-60 min before first meal of the day and Pepcid 20 mg one hour  bedtime plus chlorpheniramine 4 mg x 2 at bedtime (both available over  the counter)  until cough is completely gone for at least a week without the need for cough suppression GERD diet   Strongly consider alternative birth control method as it may well be contributing to your cough  Please schedule a follow up office visit in 6 weeks, call sooner if needed   / d/c bcps  05/16/20    07/09/2020  f/u ov/[place] office/Sara Hamilton re:  symbicort 80 2 puff  Qh only / singulair, hs pepcid only /not yet vaccinated but plans to soon  Chief Complaint  Patient presents with   Follow-up    Cough has resolved and no new co'.s   Dyspnea:  [place] neighborhood  Cough: none/ no need for cough meds /still on xyzal hs  Sleeping: fine/ pepcid at hs no ppi  SABA use: none 02:  None  Rec After a couple of weeks of just using the  pepcid at bedtime ok to stop it.  A couple of weeks after that try just symbicort 80  1 puff at bedtime for a couple of weeks then ok to stop - if flare  immediately start back on symbicort 80 Take 2 puffs first thing in am and then another 2 puffs about 12 hours later.  If polyps start to come back Dupixent is an option.  I very strongly recommend you get the moderna or pfizer vaccine    09/12/2021  f/u ov/Sara Hamilton re: asthma/ polyps maint on singulair/advair 100 one bid  dpi due to cost  Chief Complaint  Patient presents with   Follow-up    Breathing is doing well. Still not able to smell since having sinus surgery about a year ago. She rarely uses her albuterol.   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   Sleeping  without nocturnal  or early am exacerbation  of respiratory  c/o's or need for noct saba. Also denies any obvious fluctuation of symptoms with weather or environmental changes or other aggravating or alleviating factors except as outlined above   No unusual exposure hx or h/o childhood pna/ asthma or knowledge of premature birth.  Current Allergies, Complete Past Medical History, Past Surgical History, Family History, and Social History were reviewed in [employer] record.  ROS  The following are not active complaints unless bolded Hoarseness, sore throat, dysphagia, dental problems, itching, sneezing,  nasal congestion or discharge of excess mucus or purulent secretions, ear ache,   fever, chills, sweats, unintended wt loss or wt gain, classically pleuritic or exertional cp,  orthopnea pnd or arm/hand swelling  or leg swelling, presyncope, palpitations, abdominal pain, anorexia, nausea, vomiting, diarrhea  or change in bowel habits or change in bladder habits, change in stools or change in urine, dysuria, hematuria,  rash, arthralgias, visual complaints, headache, numbness, weakness or ataxia or problems with walking or coordination,  change in mood or  memory. Anosmia         Current Meds  Medication Sig   albuterol (VENTOLIN HFA) 108 (90 Base) MCG/ACT  inhaler Inhale 2 puffs into the lungs every 6 (six) hours as needed for wheezing or shortness of breath.   famotidine (PEPCID) 20 MG tablet One after supper   fluticasone (FLONASE) 50 MCG/ACT nasal spray Place 2 sprays into both nostrils at bedtime.    fluticasone-salmeterol (ADVAIR DISKUS) 100-50 MCG/ACT AEPB Inhale 1 puff into the lungs 2 (two) times daily.   levocetirizine (XYZAL) 5 MG tablet Take 5 mg  by mouth every evening.   montelukast (SINGULAIR) 10 MG tablet Take 1 tablet (10 mg total) by mouth at bedtime. No future refill needs appointment.               Objective:   Physical Exam   09/12/2021       189 07/09/2020      182 05/16/2020         182  07/04/2019       183   04/30/18 176 lb (79.8 kg)  12/16/17 165 lb 3.2 oz (74.9 kg)  11/30/17 163 lb (73.9 kg)    Vital signs reviewed  09/12/2021  - Note at rest 02 sats  100% on RA   General appearance:    pleasant amb wf slt nasal tone to voice   HEENT : pt wearing mask not removed for exam due to covid -19 concerns.    NECK :  without JVD/Nodes/TM/ nl carotid upstrokes bilaterally   LUNGS: no acc muscle use,  Nl contour chest which is clear to A and P bilaterally without cough on insp or exp maneuvers   CV:  RRR  no s3 or murmur or increase in P2, and no edema   ABD:  soft and nontender with nl inspiratory excursion in the supine position. No bruits or organomegaly appreciated, bowel sounds nl  MS:  Nl gait/ ext warm without deformities, calf tenderness, cyanosis or clubbing No obvious joint restrictions   SKIN: warm and dry without lesions    NEURO:  alert, approp, nl sensorium with  no motor or cerebellar deficits apparent.       Assessment:

## 2021-09-13 ENCOUNTER — Ambulatory Visit: Payer: 59 | Admitting: Allergy & Immunology

## 2021-09-17 ENCOUNTER — Other Ambulatory Visit: Payer: Self-pay | Admitting: Internal Medicine

## 2021-09-18 ENCOUNTER — Other Ambulatory Visit (HOSPITAL_COMMUNITY): Payer: Self-pay

## 2021-09-18 MED ORDER — MONTELUKAST SODIUM 10 MG PO TABS
10.0000 mg | ORAL_TABLET | Freq: Every day | ORAL | 11 refills | Status: DC
  Filled 2021-09-18: qty 30, 30d supply, fill #0
  Filled 2021-10-16: qty 90, 90d supply, fill #1
  Filled 2022-01-10: qty 90, 90d supply, fill #2
  Filled 2022-04-02: qty 90, 90d supply, fill #3

## 2021-10-02 ENCOUNTER — Other Ambulatory Visit: Payer: Self-pay

## 2021-10-02 ENCOUNTER — Ambulatory Visit: Payer: 59 | Admitting: Allergy & Immunology

## 2021-10-02 VITALS — BP 124/72 | HR 83 | Temp 98.8°F | Resp 20 | Ht 66.0 in | Wt 188.4 lb

## 2021-10-02 DIAGNOSIS — J339 Nasal polyp, unspecified: Secondary | ICD-10-CM | POA: Diagnosis not present

## 2021-10-02 DIAGNOSIS — J302 Other seasonal allergic rhinitis: Secondary | ICD-10-CM

## 2021-10-02 DIAGNOSIS — J4540 Moderate persistent asthma, uncomplicated: Secondary | ICD-10-CM

## 2021-10-02 DIAGNOSIS — J3089 Other allergic rhinitis: Secondary | ICD-10-CM

## 2021-10-02 MED ORDER — DUPILUMAB 300 MG/2ML ~~LOC~~ SOSY
600.0000 mg | PREFILLED_SYRINGE | Freq: Once | SUBCUTANEOUS | Status: AC
  Administered 2021-10-02: 600 mg via SUBCUTANEOUS

## 2021-10-02 NOTE — Patient Instructions (Addendum)
1. Moderate persistent asthma, uncomplicated - Lung testing looks fairly good. - We are not going to make any medication changes since [name] has things under control. - Daily controller medication(s): Singulair 10mg  daily and Advair 100/78mcg one puff twice daily and Dupixent 300mg  every two weeks - Prior to physical activity: albuterol 2 puffs 10-15 minutes before physical activity. - Rescue medications: albuterol 4 puffs every 4-6 hours as needed - Asthma control goals:  * Full participation in all desired activities (may need albuterol before activity) * Albuterol use two time or less a week on average (not counting use with activity) * Cough interfering with sleep two time or less a month * Oral steroids no more than once a year * No hospitalizations  2. Seasonal and perennial allergic rhinitis (mouse, 45m pig, weeds, grasses, outdoor molds and dust mites) - Continue levocetirizine 5 mg daily. - Continue with montelukast 10mg  daily. - Stop the fluticasone and start Xhance one spray per nostril twice daily (sample provided).   3. Nasal polyposis - We are starting the Dupixent today (loading dose provided). - We will get this approved for asthma. - We are adding on the Xhance one spray per nostril twice daily.  4. Return in about 3 months (around 01/02/2022).    Please inform [place] of any Emergency Department visits, hospitalizations, or changes in symptoms. Call before going to the ED for breathing or allergy symptoms since we might be able to fit you in for a sick visit. Feel free to contact 01/04/2022 anytime with any questions, problems, or concerns.  It was a pleasure to see you again today! I am glad you finally came back! 4 weeks is almost like 4 years! :-)   Websites that have reliable patient information: 1. American Academy of Asthma, Allergy, and Immunology: www.aaaai.org 2. Food Allergy Research and Education (FARE): foodallergy.org 3. Mothers of Asthmatics:  http://www.asthmacommunitynetwork.org 4. American College of Allergy, Asthma, and Immunology: www.acaai.org   COVID-19 Vaccine Information can be found at: [place] For questions related to vaccine distribution or appointments, please email [email or web address] or call [phone].   We realize that you might be concerned about having an allergic reaction to the COVID19 vaccines. To help with that concern, WE ARE OFFERING THE COVID19 VACCINES IN OUR OFFICE! Ask the front desk for dates!     "Like" [place] on Facebook and Instagram for our latest updates!      A healthy democracy works best when [email or web address] participate! Make sure you are registered to vote! If you have moved or changed any of your contact information, you will need to get this updated before voting!  In some cases, you MAY be able to register to vote online: [phone]

## 2021-10-02 NOTE — Progress Notes (Signed)
NEW PATIENT  Date of Service/Encounter:  10/02/21  Consult requested by: Sara Stabile, MD   Assessment:   Moderate persistent asthma, uncomplicated - with >10 rounds of prednisone in the last 18 months    Seasonal and perennial allergic rhinitis (mouse, [place] pig, weeds, grasses, outdoor molds and dust mites)  Nasal polyposis - s/p polypectomy  Plan/Recommendations:   1. Moderate persistent asthma, uncomplicated - Lung testing looks fairly good. - We are not going to make any medication changes since Dr. Sherene Hamilton has things under control. - Daily controller medication(s): Singulair 10mg  daily and Advair 100/41mcg one puff twice daily and Dupixent 300mg  every two weeks - Prior to physical activity: albuterol 2 puffs 10-15 minutes before physical activity. - Rescue medications: albuterol 4 puffs every 4-6 hours as needed - Asthma control goals:  * Full participation in all desired activities (may need albuterol before activity) * Albuterol use two time or less a week on average (not counting use with activity) * Cough interfering with sleep two time or less a month * Oral steroids no more than once a year * No hospitalizations  2. Seasonal and perennial allergic rhinitis (mouse, 45m pig, weeds, grasses, outdoor molds and dust mites) - Continue levocetirizine 5 mg daily. - Continue with montelukast 10mg  daily. - Stop the fluticasone and start Xhance one spray per nostril twice daily (sample provided).   3. Nasal polyposis - We are starting the Dupixent today (loading dose provided). - We will get this approved for asthma. - We are adding on the Xhance one spray per nostril twice daily.  4. Return in about 3 months (around 01/02/2022).      This note in its entirety was forwarded to the Provider who requested this consultation.  Subjective:   Sara Hamilton is a 35 y.o. female presenting today for evaluation of  Chief Complaint  Patient presents with   Allergy Testing     Patient referred back due to not being controled on anything she has tried. She has no smell for 2 years, constant stuffiness and drainage and wants a long term solution. She would like to discuss possible biologics.    Sara Hamilton has a history of the following: Patient Active Problem List   Diagnosis Date Noted   Encounter for gynecological examination with Papanicolaou smear of cervix 08/07/2021   Cough variant asthma with ? UACS /cyclical cough component  07/04/2019   Upper airway cough syndrome 05/01/2018   Seasonal and perennial allergic rhinitis 04/28/2018   Moderate persistent asthma, uncomplicated 04/28/2018   Contraceptive management 11/07/2013   Pregnancy, supervision of normal 07/02/2011    History obtained from: chart review and patient.  Sara Hamilton was referred by 11/09/2013, MD.     Sara Hamilton is a 35 y.o. female presenting for an evaluation of asthma and allergies in setting of nasal polyposis. She was last seen by me in May 2019.  At that time, she had a cough that I thought might have been moderate persistent asthma.  We started her on Symbicort.  For her rhinitis, she had testing that was positive to mouse, Sara Hamilton pig, weeds, grasses, outdoor molds, and dust mites. We continued with Allegra and added Singulair 10mg  daily.    Asthma/Respiratory Symptom History: She saw Sara Hamilton in the interim.  She is on Advair Diskus BID and Singulair at night. Advair is affordable.  Overall, asthma is under good control.  However, Dupixent was brought up and her last pulmonology appointment and Dr.  Work reached out to me to discuss.  I felt this was a good idea, so he referred her back to me.  Allergic Rhinitis Symptom History: She was referred to Dr. Suszanne Hamilton for elevated eosinophils. She had "polyps everywhere". She had major sinus surgery. She had her septum fixed and her polyps removed in 2021.  She has complete resolution with prednisone.  However, when she is off the prednisone, she has  rebound congestion.  She estimates that she has been on prednisone 18 times in the last 12 months.  She has asked Dr. Suszanne Hamilton about Dupixent, but he always recommends another round of prednisone, at least according to the patient.  She has never been on allergen immunotherapy.  She uses Flonase and has been on that for quite some time.  She has never been on Sara Hamilton.  She has otherwise done well. She is just tired of having prednisone and wants to start something that will help her to avoid the need for prednisone. She has done some reading of Dupixent and is interested in getting a sample today.   Otherwise, there is no history of other atopic diseases, including asthma, food allergies, drug allergies, stinging insect allergies, eczema, urticaria, or contact dermatitis. There is no significant infectious history. Vaccinations are up to date.    Past Medical History: Patient Active Problem List   Diagnosis Date Noted   Encounter for gynecological examination with Papanicolaou smear of cervix 08/07/2021   Cough variant asthma with ? UACS /cyclical cough component  07/04/2019   Upper airway cough syndrome 05/01/2018   Seasonal and perennial allergic rhinitis 04/28/2018   Moderate persistent asthma, uncomplicated 04/28/2018   Contraceptive management 11/07/2013   Pregnancy, supervision of normal 07/02/2011    Medication List:  Allergies as of 10/02/2021   No Known Allergies      Medication List        Accurate as of October 02, 2021  1:41 PM. If you have any questions, ask your nurse or doctor.          Advair Diskus 100-50 MCG/ACT Aepb Generic drug: fluticasone-salmeterol Inhale 1 puff into the lungs 2 (two) times daily.   albuterol 108 (90 Base) MCG/ACT inhaler Commonly known as: VENTOLIN HFA Inhale 2 puffs into the lungs every 6 (six) hours as needed for wheezing or shortness of breath.   famotidine 20 MG tablet Commonly known as: Pepcid One after supper   fluticasone 50  MCG/ACT nasal spray Commonly known as: FLONASE Place 2 sprays into both nostrils at bedtime.   levocetirizine 5 MG tablet Commonly known as: XYZAL Take 5 mg by mouth every evening.   montelukast 10 MG tablet Commonly known as: SINGULAIR Take 1 tablet (10 mg total) by mouth at bedtime. No future refill needs appointment.        Birth History: non-contributory  Developmental History: non-contributory  Past Surgical History: Past Surgical History:  Procedure Laterality Date   COLPOSCOPY     ETHMOIDECTOMY Bilateral 04/30/2020   Procedure: BILATERAL ETHMOIDECTOMY;  Surgeon: Newman Pies, MD;  Location: [facility];  Service: ENT;  Laterality: Bilateral;   FRONTAL SINUS EXPLORATION Bilateral 04/30/2020   Procedure: BILATERAL FRONTAL RECESS EXPLORATION;  Surgeon: Newman Pies, MD;  Location: [facility];  Service: ENT;  Laterality: Bilateral;   MAXILLARY ANTROSTOMY Bilateral 04/30/2020   Procedure: BILATERAL MAXILLARY ANTROSTOMY WITH TISSUE REMOVAL;  Surgeon: Newman Pies, MD;  Location:  SURGERY CENTER;  Service: ENT;  Laterality: Bilateral;   NASAL SEPTOPLASTY  W/ TURBINOPLASTY Bilateral 04/30/2020   Procedure: NASAL SEPTOPLASTY WITH TURBINATE REDUCTION;  Surgeon: Newman Pies, MD;  Location: [facility];  Service: ENT;  Laterality: Bilateral;   SINUS ENDO WITH FUSION Bilateral 04/30/2020   Procedure: SINUS ENDOSCOPY WITH FUSION NAVIGATION;  Surgeon: Newman Pies, MD;  Location: [facility];  Service: ENT;  Laterality: Bilateral;   SPHENOIDECTOMY Bilateral 04/30/2020   Procedure: BILATERAL SPHENOIDECTOMY;  Surgeon: Newman Pies, MD;  Location: [facility];  Service: ENT;  Laterality: Bilateral;     Family History: Family History  Problem Relation Age of Onset   Heart defect Sister    Cancer Maternal Grandmother    Cancer Paternal Grandmother    Diabetes Paternal Grandmother      Social History: Ayn lives at home with her  family. She works as a Buyer, retail at [employer].  She has been a house that is 36 years old.  There is hardwood throughout the home.  She has gas heating and electric cooling.  There is 1 dog inside of the home.  There are dust mite covers on the bed, but not the pillows.  She is not exposed to fumes, chemicals, or dust.  She does not have a HEPA filter.  She does not live near an interstate or industrial area.   Review of Systems  Constitutional: Negative.  Negative for chills, fever, malaise/fatigue and weight loss.  HENT:  Positive for congestion and sinus pain. Negative for ear discharge and ear pain.   Eyes:  Negative for pain, discharge and redness.  Respiratory:  Positive for sputum production, shortness of breath and wheezing. Negative for cough.   Cardiovascular: Negative.  Negative for chest pain and palpitations.  Gastrointestinal:  Negative for abdominal pain, constipation, diarrhea, heartburn, nausea and vomiting.  Skin: Negative.  Negative for itching and rash.  Neurological:  Negative for dizziness and headaches.  Endo/Heme/Allergies:  Positive for environmental allergies. Does not bruise/bleed easily.      Objective:   Blood pressure 124/72, pulse 83, temperature 98.8 F (37.1 C), temperature source Temporal, resp. rate 20, height 5\' 6"  (1.676 m), weight 188 lb 6.4 oz (85.5 kg), SpO2 97 %. Body mass index is 30.41 kg/m.   Physical Exam:   Physical Exam Vitals reviewed.  Constitutional:      Appearance: She is well-developed.  HENT:     Head: Normocephalic and atraumatic.     Right Ear: Tympanic membrane, ear canal and external ear normal. No drainage, swelling or tenderness. Tympanic membrane is not injected, scarred, erythematous, retracted or bulging.     Left Ear: Tympanic membrane, ear canal and external ear normal. No drainage, swelling or tenderness. Tympanic membrane is not injected, scarred, erythematous, retracted or bulging.     Nose: No nasal  deformity, septal deviation, mucosal edema or rhinorrhea.     Right Turbinates: Enlarged, swollen and pale.     Left Turbinates: Enlarged, swollen and pale.     Right Sinus: No maxillary sinus tenderness or frontal sinus tenderness.     Left Sinus: No maxillary sinus tenderness or frontal sinus tenderness.     Comments: I did not see any nasal polyps, but there were certainly some post surgical changes present. No epistaxis. Turbinates enlarged and somewhat difficult to see past.     Mouth/Throat:     Mouth: Mucous membranes are not pale and not dry.     Pharynx: Uvula midline.  Eyes:     General:  Right eye: No discharge.        Left eye: No discharge.     Conjunctiva/sclera: Conjunctivae normal.     Right eye: Right conjunctiva is not injected. No chemosis.    Left eye: Left conjunctiva is not injected. No chemosis.    Pupils: Pupils are equal, round, and reactive to light.  Cardiovascular:     Rate and Rhythm: Normal rate and regular rhythm.     Heart sounds: Normal heart sounds.  Pulmonary:     Effort: Pulmonary effort is normal. No tachypnea, accessory muscle usage or respiratory distress.     Breath sounds: Normal breath sounds. No wheezing, rhonchi or rales.     Comments: Moving air well in all lung fields. No increased Hamilton of breathing noted. No increased Hamilton of breathing noted.  Chest:     Chest wall: No tenderness.  Abdominal:     Tenderness: There is no abdominal tenderness. There is no guarding or rebound.  Lymphadenopathy:     Head:     Right side of head: No submandibular, tonsillar or occipital adenopathy.     Left side of head: No submandibular, tonsillar or occipital adenopathy.     Cervical: No cervical adenopathy.  Skin:    General: Skin is warm.     Capillary Refill: Capillary refill takes less than 2 seconds.     Coloration: Skin is not pale.     Findings: No abrasion, erythema, petechiae or rash. Rash is not papular, urticarial or vesicular.   Neurological:     Mental Status: She is alert.     Diagnostic studies:   Spirometry: results normal (FEV1: 2.78/64%, FVC: 3.65/921%, FEV1/FVC: 76%).    Spirometry consistent with normal pattern.    Allergy Studies: none            [name], MD [facility]

## 2021-10-07 NOTE — Progress Notes (Signed)
Hey I cant find any info that will support Dupixent... no past eos in chart, endoscopy, or steroid use but 9/29/ Did we have additional paperwork for this patient

## 2021-10-10 ENCOUNTER — Other Ambulatory Visit: Payer: Self-pay

## 2021-10-10 ENCOUNTER — Encounter: Payer: Self-pay | Admitting: Allergy & Immunology

## 2021-10-10 MED ORDER — XHANCE 93 MCG/ACT NA EXHU: 2.0000 | INHALANT_SUSPENSION | Freq: Two times a day (BID) | NASAL | 5 refills | Status: DC

## 2021-10-11 ENCOUNTER — Telehealth: Payer: Self-pay

## 2021-10-11 ENCOUNTER — Telehealth: Payer: Self-pay | Admitting: *Deleted

## 2021-10-11 DIAGNOSIS — J4540 Moderate persistent asthma, uncomplicated: Secondary | ICD-10-CM | POA: Diagnosis not present

## 2021-10-11 NOTE — Telephone Encounter (Signed)
error 

## 2021-10-11 NOTE — Telephone Encounter (Signed)
Called and left a voicemail asking for patient to return call to discuss.      [name], MD  [facility] Clinical Cc: [name], CMA Nursing pool - can we call the patient to see where she has been getting prednisone for her breathing?   Also can we check to see if she has had any blood work anywhere else outside of [facility]?   Let's order a CBC with differential to see if we can catch high enough eosinophils.   [name], MD  [facility]

## 2021-10-11 NOTE — Telephone Encounter (Signed)
We will reach out once we have this approved. Then we can schedule the next visit. We are getting all of this to get the Dupixent approved.   Can we get records from Dr. Avel Sensor office so we can fill in holes regarding prednisone use from there?   Review of Epic shows that she received prednisone in May 2019 x2, July 2020, April 2021, June 2021, and December 2021.   [name], MD [facility]

## 2021-10-11 NOTE — Addendum Note (Signed)
Addended by: Dollene Cleveland R on: 10/11/2021 09:34 AM   Modules accepted: Orders

## 2021-10-11 NOTE — Telephone Encounter (Signed)
I called and spoke with the patient and she used to get her Prednisone in the past from [name], Der. Teoh and her PCP. She stated that she has not had any previous blood work done since early last year with Dr. Margo Aye before she had her sinus surgery. I did order a CBC with Diff and spoke with Ferdie Ping and she is going to print the requisition and [name] is going to pick it up today in the [place] office so that she can get the blood work done. She was wondering if she should go ahead and schedule for her next Dupixent. I advised that I believe this is what the blood work is for to help with her insurance to cover an injectable biologic. Is that correct?

## 2021-10-11 NOTE — Telephone Encounter (Signed)
-----   Message from [name], MD sent at 10/09/2021  6:06 AM EDT ----- Nursing pool - can we call the patient to see where she has been getting prednisone for her breathing?   Also can we check to see if she has had any blood work anywhere else outside of [facility]?   Let's order a CBC with differential to see if we can catch high enough eosinophils.   [name], MD [facility]  ----- Message ----- From: [name], CMA Sent: 10/02/2021   4:33 PM EDT To: [name], MD  I just looked at chart. Do we have any cbc she may have had elsewhere? Also if for asthma not sure I can get approved without documentation of steroid use only seen 2021 notes of use.  We could probably try for polyps but she would probably need Xhance failure. [name]  ----- Message ----- From: [name], MD Sent: 10/02/2021   2:18 PM EDT To: [name], CMA  Started Dupixent for asthma. History of elevated eosinophils although I do not have those in the system. But she has had lots of steroids and has a history of nasla polyps (sees Dr. Suszanne Conners). Gave loading dose of Dupixent.

## 2021-10-11 NOTE — Progress Notes (Addendum)
Immunotherapy   Patient Details  Name: Sara Hamilton MRN: [id number] Date of Birth: [birth year]    [name] started Dupixent for asthma. Patient received a loading dose of 600 mg with no problems.  Consent signed and patient instructions given.   [name]

## 2021-10-12 LAB — CBC WITH DIFFERENTIAL
Basophils Absolute: 0 10*3/uL (ref 0.0–0.2)
Basos: 0 %
EOS (ABSOLUTE): 0.2 10*3/uL (ref 0.0–0.4)
Eos: 3 %
Hematocrit: 37.3 % (ref 34.0–46.6)
Hemoglobin: 12.7 g/dL (ref 11.1–15.9)
Immature Grans (Abs): 0 10*3/uL (ref 0.0–0.1)
Immature Granulocytes: 0 %
Lymphocytes Absolute: 2.2 10*3/uL (ref 0.7–3.1)
Lymphs: 29 %
MCH: 31.4 pg (ref 26.6–33.0)
MCHC: 34 g/dL (ref 31.5–35.7)
MCV: 92 fL (ref 79–97)
Monocytes Absolute: 0.5 10*3/uL (ref 0.1–0.9)
Monocytes: 7 %
Neutrophils Absolute: 4.7 10*3/uL (ref 1.4–7.0)
Neutrophils: 61 %
RBC: 4.05 x10E6/uL (ref 3.77–5.28)
RDW: 10.7 % — ABNORMAL LOW (ref 11.7–15.4)
WBC: 7.8 10*3/uL (ref 3.4–10.8)

## 2021-10-14 ENCOUNTER — Telehealth: Payer: Self-pay | Admitting: *Deleted

## 2021-10-14 NOTE — Telephone Encounter (Signed)
PA has been submitted through CoverMyMeds for Xhance and is currently pending approval/denial.  

## 2021-10-14 NOTE — Telephone Encounter (Signed)
PA was approved for [facility] and has been sent electronically to [facility] pharmacy. PA is good for 1 year.

## 2021-10-16 ENCOUNTER — Other Ambulatory Visit (HOSPITAL_COMMUNITY): Payer: Self-pay

## 2021-10-17 ENCOUNTER — Other Ambulatory Visit (HOSPITAL_COMMUNITY): Payer: Self-pay

## 2021-10-17 ENCOUNTER — Other Ambulatory Visit: Payer: Self-pay | Admitting: *Deleted

## 2021-10-17 ENCOUNTER — Telehealth: Payer: Self-pay | Admitting: *Deleted

## 2021-10-17 ENCOUNTER — Ambulatory Visit: Payer: 59 | Attending: Family Medicine | Admitting: Pharmacist

## 2021-10-17 ENCOUNTER — Other Ambulatory Visit: Payer: Self-pay | Admitting: Allergy & Immunology

## 2021-10-17 ENCOUNTER — Other Ambulatory Visit: Payer: Self-pay | Admitting: Pharmacist

## 2021-10-17 ENCOUNTER — Other Ambulatory Visit: Payer: Self-pay

## 2021-10-17 DIAGNOSIS — Z79899 Other long term (current) drug therapy: Secondary | ICD-10-CM

## 2021-10-17 MED ORDER — DUPIXENT 300 MG/2ML ~~LOC~~ SOSY
600.0000 mg | PREFILLED_SYRINGE | Freq: Once | SUBCUTANEOUS | 11 refills | Status: DC
  Filled 2021-10-17: qty 4, 1d supply, fill #0

## 2021-10-17 MED ORDER — DUPIXENT 300 MG/2ML ~~LOC~~ SOSY
600.0000 mg | PREFILLED_SYRINGE | Freq: Once | SUBCUTANEOUS | 0 refills | Status: DC
  Filled 2021-10-17: qty 4, 1d supply, fill #0

## 2021-10-17 MED ORDER — DUPIXENT 300 MG/2ML ~~LOC~~ SOSY
600.0000 mg | PREFILLED_SYRINGE | Freq: Once | SUBCUTANEOUS | 0 refills | Status: AC
  Filled 2021-10-17: qty 4, 14d supply, fill #0

## 2021-10-17 MED ORDER — DUPIXENT 300 MG/2ML ~~LOC~~ SOSY
600.0000 mg | PREFILLED_SYRINGE | Freq: Once | SUBCUTANEOUS | 11 refills | Status: DC
  Filled 2021-10-17: qty 4, 1d supply, fill #0
  Filled 2021-10-17: qty 4, 14d supply, fill #0

## 2021-10-17 MED ORDER — DUPIXENT 300 MG/2ML ~~LOC~~ SOSY
300.0000 mg | PREFILLED_SYRINGE | SUBCUTANEOUS | 11 refills | Status: DC
  Filled 2021-10-17 – 2021-11-14 (×4): qty 4, 28d supply, fill #0
  Filled 2021-12-03: qty 4, 28d supply, fill #1
  Filled 2022-01-01: qty 4, 28d supply, fill #2
  Filled 2022-01-27: qty 4, 28d supply, fill #3
  Filled 2022-02-27: qty 4, 28d supply, fill #4
  Filled 2022-03-28: qty 4, 28d supply, fill #5
  Filled 2022-04-29: qty 4, 28d supply, fill #6
  Filled 2022-05-27: qty 4, 28d supply, fill #7
  Filled 2022-06-26: qty 4, 28d supply, fill #8
  Filled 2022-07-28: qty 4, 28d supply, fill #9
  Filled 2022-08-21: qty 4, 28d supply, fill #10
  Filled 2022-09-18: qty 4, 28d supply, fill #11

## 2021-10-17 MED ORDER — DUPIXENT 300 MG/2ML ~~LOC~~ SOSY
300.0000 mg | PREFILLED_SYRINGE | SUBCUTANEOUS | 11 refills | Status: DC
  Filled 2021-10-17: qty 4, 28d supply, fill #0

## 2021-10-17 NOTE — Telephone Encounter (Signed)
Called patient and advised aprpoval and submit for Dupixent. Patient is familiar with admin and will give herself injections one syringe every 14 days. Storage and delivery instrux given. Also advised copay card that will be emailed to patient to provide to pharmacy for $0 copay

## 2021-10-17 NOTE — Telephone Encounter (Signed)
-----   Message from [name], MD sent at 10/02/2021  2:18 PM EDT ----- Started Dupixent for asthma. History of elevated eosinophils although I do not have those in the system. But she has had lots of steroids and has a history of nasla polyps (sees Dr. Suszanne Conners). Gave loading dose of Dupixent.

## 2021-10-17 NOTE — Telephone Encounter (Signed)
Dupixent approved and advised patient delivery,storage and dosing instrux along with copay card to her email and Rx to [facility]

## 2021-10-17 NOTE — Progress Notes (Signed)
   S: Patient presents for review of their specialty medication therapy.  Patient is currently taking Dupixent for asthma/nasal polyposis. Patient is managed by Dr. [name] for this.   Adherence: confirms. Completed her 600 mg LD a couple of weeks ago.   Efficacy: can already tell a difference. Noticed a difference ~24-48 hrs after completing her LD.   Dosing: 600 mg subq x1 followed by 300 mg q14days  Dose adjustments: Renal: no dose adjustments (has not been studied) Hepatic: no dose adjustments (has not been studied)  Drug-drug interactions: none identified   Screening: TB test: completed   Monitoring: S/sx of infection: none S/sx of hypersensitivity: none S/sx of ocular effects: none S/sx of eosinophilia/vasculitis: none  O:     Lab Results  Component Value Date   WBC 7.8 10/11/2021   HGB 12.7 10/11/2021   HCT 37.3 10/11/2021   MCV 92 10/11/2021   PLT 230 07/03/2011      Chemistry   No results found for: NA, K, CL, CO2, BUN, CREATININE, GLU No results found for: CALCIUM, ALKPHOS, AST, ALT, BILITOT     A/P: 1. Medication review: Patient currently on Dupixent for asthma/nasal polyposis. Reviewed the medication with the patient, including the following: Dupixent is a monoclonal antibody used for the treatment of asthma or atopic dermatitis. Patient educated on purpose, proper use and potential adverse effects of Dupixent. Possible adverse effects include increased risk of infection, ocular effects, vasculitis/eosinophilia, and hypersensitivity reactions. Administer as a SubQ injection and rotate sites. Allow the medication to reach room temp prior to administration (45 mins for 300 mg syringe or 30 min for 200 mg syringe). Do not shake. Discard any unused portion. No recommendations for any changes.   Butch Penny, PharmD, Patsy [place], CPP Clinical Pharmacist [facility] [phone]

## 2021-10-17 NOTE — Telephone Encounter (Signed)
Awesome - thanks [name]!   [name], MD [facility]

## 2021-10-18 ENCOUNTER — Other Ambulatory Visit (HOSPITAL_COMMUNITY): Payer: Self-pay

## 2021-11-05 ENCOUNTER — Other Ambulatory Visit (HOSPITAL_COMMUNITY): Payer: Self-pay

## 2021-11-06 ENCOUNTER — Other Ambulatory Visit (HOSPITAL_COMMUNITY): Payer: Self-pay

## 2021-11-11 ENCOUNTER — Other Ambulatory Visit (HOSPITAL_COMMUNITY): Payer: Self-pay

## 2021-11-11 ENCOUNTER — Other Ambulatory Visit: Payer: Self-pay | Admitting: Internal Medicine

## 2021-11-11 MED ORDER — ALBUTEROL SULFATE HFA 108 (90 BASE) MCG/ACT IN AERS
2.0000 | INHALATION_SPRAY | Freq: Four times a day (QID) | RESPIRATORY_TRACT | 3 refills | Status: DC | PRN
  Filled 2021-11-11: qty 18, 25d supply, fill #0

## 2021-11-12 ENCOUNTER — Other Ambulatory Visit (HOSPITAL_COMMUNITY): Payer: Self-pay

## 2021-11-14 ENCOUNTER — Other Ambulatory Visit (HOSPITAL_COMMUNITY): Payer: Self-pay

## 2021-12-03 ENCOUNTER — Other Ambulatory Visit (HOSPITAL_COMMUNITY): Payer: Self-pay

## 2021-12-10 ENCOUNTER — Other Ambulatory Visit (HOSPITAL_COMMUNITY): Payer: Self-pay

## 2022-01-01 ENCOUNTER — Other Ambulatory Visit (HOSPITAL_COMMUNITY): Payer: Self-pay

## 2022-01-06 ENCOUNTER — Other Ambulatory Visit (HOSPITAL_COMMUNITY): Payer: Self-pay

## 2022-01-07 ENCOUNTER — Ambulatory Visit: Payer: 59 | Admitting: Allergy & Immunology

## 2022-01-10 ENCOUNTER — Other Ambulatory Visit (HOSPITAL_COMMUNITY): Payer: Self-pay

## 2022-01-23 NOTE — Patient Instructions (Addendum)
1. Moderate persistent asthma, uncomplicated - Daily controller medication(s): Singulair 10mg  daily and Advair 100/66mcg one puff twice daily and Dupixent 300mg  every two weeks - Prior to physical activity: albuterol 2 puffs 10-15 minutes before physical activity. - Rescue medications: albuterol 4 puffs every 4-6 hours as needed - Asthma control goals:  * Full participation in all desired activities (may need albuterol before activity) * Albuterol use two time or less a week on average (not counting use with activity) * Cough interfering with sleep two time or less a month * Oral steroids no more than once a year * No hospitalizations  2. Seasonal and perennial allergic rhinitis (mouse, [place] pig, weeds, grasses, outdoor molds and dust mites) - Continue levocetirizine 5 mg daily. - Continue with montelukast 10mg  daily. - Continue Xhance one spray per nostril twice daily   3. Nasal polyposis - Continue Dupixent as above - Continue Xhance one spray per nostril twice daily.  4. Schedule a follow up appointment in 4-6 months or sooner if needed

## 2022-01-24 ENCOUNTER — Other Ambulatory Visit: Payer: Self-pay

## 2022-01-24 ENCOUNTER — Ambulatory Visit: Payer: 59 | Admitting: Family

## 2022-01-24 ENCOUNTER — Encounter: Payer: Self-pay | Admitting: Family

## 2022-01-24 VITALS — BP 110/80 | HR 85 | Resp 17

## 2022-01-24 DIAGNOSIS — J3089 Other allergic rhinitis: Secondary | ICD-10-CM | POA: Diagnosis not present

## 2022-01-24 DIAGNOSIS — J4540 Moderate persistent asthma, uncomplicated: Secondary | ICD-10-CM

## 2022-01-24 DIAGNOSIS — J339 Nasal polyp, unspecified: Secondary | ICD-10-CM | POA: Diagnosis not present

## 2022-01-24 DIAGNOSIS — J302 Other seasonal allergic rhinitis: Secondary | ICD-10-CM

## 2022-01-24 NOTE — Progress Notes (Signed)
[address] [place]  [place] [address] Dept: [phone]  FOLLOW UP NOTE  Patient ID: Sara Hamilton, female    DOB: 1986/08/06  Age: 36 y.o. MRN: [id number] Date of Office Visit: 01/24/2022  Assessment  Chief Complaint: Asthma  HPI Sara Hamilton is a 36 year old female who presents today for follow-up moderate persistent asthma, seasonal and perennial allergic rhinitis, and nasal polyposis-status post polypectomy.  She was last seen on October 02, 2021 by Dr. [name].  Since her last office visit she denies any new diagnosis or surgeries.  Moderate persistent asthma is reported as controlled with Singulair 10 mg once a day, Advair 100/50 mcg 1 puff twice a day, and Dupixent 300 mg injections every 2 weeks, and albuterol as needed.  She denies coughing, wheezing, tightness in chest, shortness of breath, and nocturnal awakenings due to breathing problems.  Since her last office visit she has not required any systemic steroids or made any trips to the emergency room or urgent care due to breathing problems.  She has not had to use her albuterol inhaler since starting Dupixent injections.  She denies any reactions/problems with the Dupixent injections.  Her next injection is due this coming Monday.  She continues to follow with [name], her pulmonologist, that she sees once a year.  Seasonal and perennial allergic rhinitis is reported as controlled with Xyzal 5 mg at night, Singulair 10 mg once a day, and Xhance 2 sprays each nostril once a day.  She reports in the beginning when she first started using Xhance when she did 2 sprays each nostril twice a day that she noticed a little bloody/pink-tinged rhinorrhea.  Since decreasing to 2 puffs once a day she has not had this problem.  She denies rhinorrhea, nasal congestion, and postnasal drip.  She has not had any sinus infections since we last saw her.  Nasal polyposis is reported as controlled with Dupixent 300 mg injection every 2  weeks and Xhance 2 sprays each nostril once a day.  She reports that her nasal polypectomy surgery was around March 2021.  Shortly after starting her Dupixent injections she was able to now taste and smell.   Drug Allergies:  No Known Allergies  Review of Systems: Review of Systems  Constitutional:  Negative for chills and fever.  HENT:         Denies rhinorrhea, nasal congestion, and postnasal drip  Eyes:        Denies itchy watery eyes  Respiratory:  Negative for cough, shortness of breath and wheezing.   Cardiovascular:  Negative for chest pain and palpitations.  Gastrointestinal:        Denies heartburn or reflux symptoms  Genitourinary:  Negative for frequency.  Skin:  Negative for itching and rash.  Neurological:  Negative for headaches.  Endo/Heme/Allergies:  Positive for environmental allergies.    Physical Exam: BP 110/80    Pulse 85    Resp 17    SpO2 99%    Physical Exam Constitutional:      Appearance: Normal appearance.  HENT:     Head: Normocephalic and atraumatic.     Comments: Pharynx normal, eyes normal, ears normal, nose: Bilateral lower turbinates mildly edematous slightly erythematous with no drainage noted    Right Ear: Tympanic membrane, ear canal and external ear normal.     Left Ear: Tympanic membrane, ear canal and external ear normal.     Mouth/Throat:     Mouth: Mucous membranes are moist.  Pharynx: Oropharynx is clear.  Eyes:     Conjunctiva/sclera: Conjunctivae normal.  Cardiovascular:     Rate and Rhythm: Normal rate and regular rhythm.     Heart sounds: Normal heart sounds.  Pulmonary:     Effort: Pulmonary effort is normal.     Breath sounds: Normal breath sounds.     Comments: Lungs clear to auscultation Musculoskeletal:     Cervical back: Neck supple.  Skin:    General: Skin is warm.  Neurological:     Mental Status: She is alert and oriented to person, place, and time.  Psychiatric:        Mood and Affect: Mood normal.         Behavior: Behavior normal.        Thought Content: Thought content normal.        Judgment: Judgment normal.    Diagnostics: FVC 3.95 L, FEV1 3.23 L.  (85%).  Predicted FVC 4.62 L, predicted FEV1 3.80 L.  Spirometry indicates normal respiratory function  Assessment and Plan: 1. Moderate persistent asthma, uncomplicated   2. Seasonal and perennial allergic rhinitis   3. Nasal polyposis     No orders of the defined types were placed in this encounter.   Patient Instructions  1. Moderate persistent asthma, uncomplicated - Daily controller medication(s): Singulair 10mg  daily and Advair 100/21mcg one puff twice daily and Dupixent 300mg  every two weeks - Prior to physical activity: albuterol 2 puffs 10-15 minutes before physical activity. - Rescue medications: albuterol 4 puffs every 4-6 hours as needed - Asthma control goals:  * Full participation in all desired activities (may need albuterol before activity) * Albuterol use two time or less a week on average (not counting use with activity) * Cough interfering with sleep two time or less a month * Oral steroids no more than once a year * No hospitalizations  2. Seasonal and perennial allergic rhinitis (mouse, 45m pig, weeds, grasses, outdoor molds and dust mites) - Continue levocetirizine 5 mg daily. - Continue with montelukast 10mg  daily. - Continue Xhance one spray per nostril twice daily   3. Nasal polyposis - Continue Dupixent as above - Continue Xhance one spray per nostril twice daily.  4. Schedule a follow up appointment in 4-6 months or sooner if needed     Return in about 4 months (around 05/24/2022), or if symptoms worsen or fail to improve.    Thank you for the opportunity to care for this patient.  Please do not hesitate to contact me with questions.  [place], FNP [facility]

## 2022-01-27 ENCOUNTER — Other Ambulatory Visit (HOSPITAL_COMMUNITY): Payer: Self-pay

## 2022-01-28 ENCOUNTER — Ambulatory Visit: Payer: 59 | Admitting: Allergy & Immunology

## 2022-02-05 ENCOUNTER — Telehealth: Payer: 59 | Admitting: Family

## 2022-02-05 ENCOUNTER — Other Ambulatory Visit (HOSPITAL_COMMUNITY): Payer: Self-pay

## 2022-02-05 DIAGNOSIS — J069 Acute upper respiratory infection, unspecified: Secondary | ICD-10-CM

## 2022-02-05 MED ORDER — FLUTICASONE PROPIONATE 50 MCG/ACT NA SUSP: 2.0000 | Freq: Every day | NASAL | 6 refills | Status: DC

## 2022-02-05 NOTE — Progress Notes (Signed)

## 2022-02-06 ENCOUNTER — Other Ambulatory Visit (HOSPITAL_COMMUNITY): Payer: Self-pay

## 2022-02-07 ENCOUNTER — Other Ambulatory Visit (HOSPITAL_COMMUNITY): Payer: Self-pay

## 2022-02-10 ENCOUNTER — Other Ambulatory Visit (HOSPITAL_COMMUNITY): Payer: Self-pay

## 2022-02-27 ENCOUNTER — Other Ambulatory Visit (HOSPITAL_COMMUNITY): Payer: Self-pay

## 2022-03-03 ENCOUNTER — Other Ambulatory Visit (HOSPITAL_COMMUNITY): Payer: Self-pay

## 2022-03-06 ENCOUNTER — Other Ambulatory Visit (HOSPITAL_COMMUNITY): Payer: Self-pay

## 2022-03-21 IMAGING — CT CT MAXILLOFACIAL W/O CM
3 series · 14 of 47 positions shown, 16 images · non-contrast
Comparison: None.

CLINICAL DATA: Chronic maxillary sinusitis

EXAM:
CT MAXILLOFACIAL WITHOUT CONTRAST
TECHNIQUE: Multidetector CT imaging of the maxillofacial structures was
performed. Multiplanar CT image reconstructions were also generated.

[Series 2: standard · axial · 0.38mm/px · z∈[+40,+140]mm · 8 of 115 slices shown, 10 images]
[im 8/115  brain]
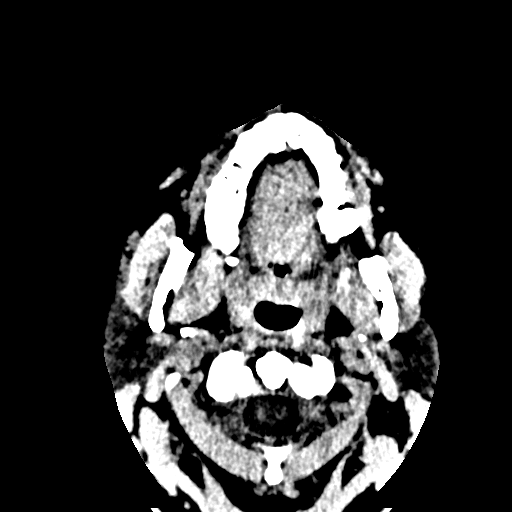
[im 8/115  bone]
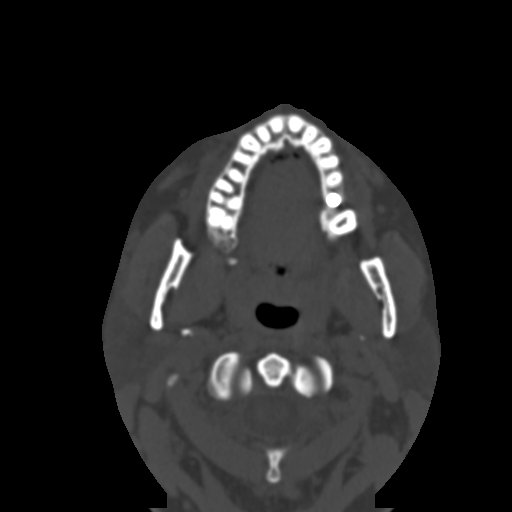
[im 24/115  bone]
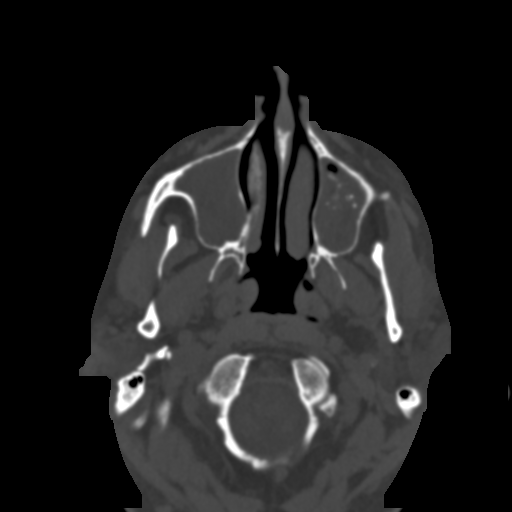
[im 36/115  bone]
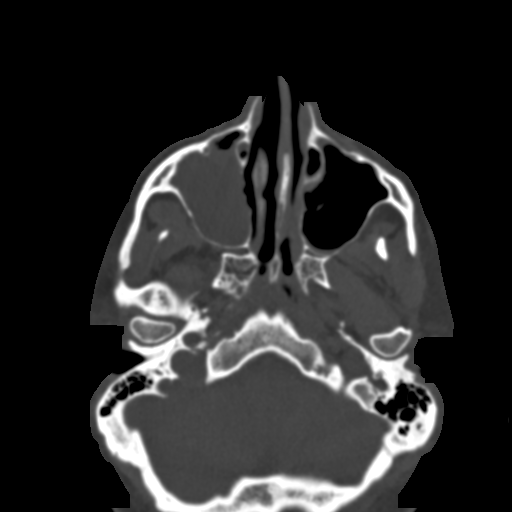
[im 52/115  bone]
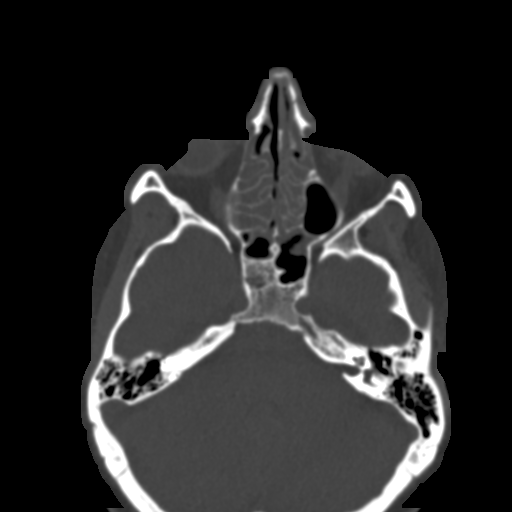
[im 63/115  brain]
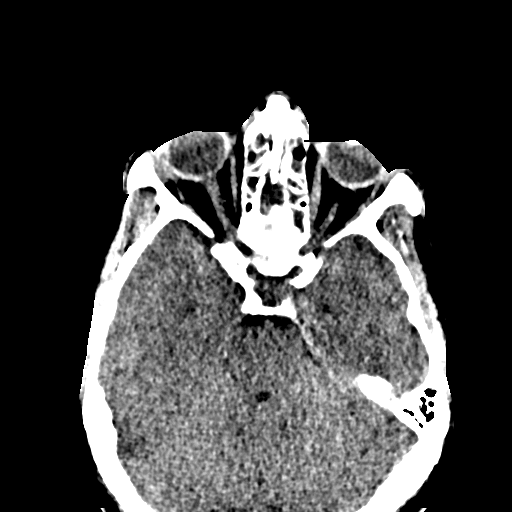
[im 63/115  bone]
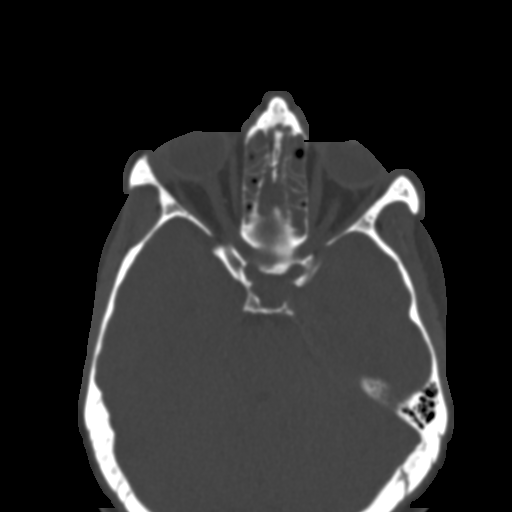
[im 79/115  bone]
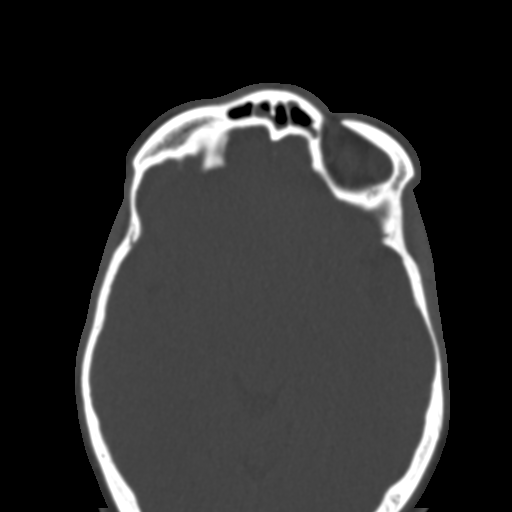
[im 91/115  bone]
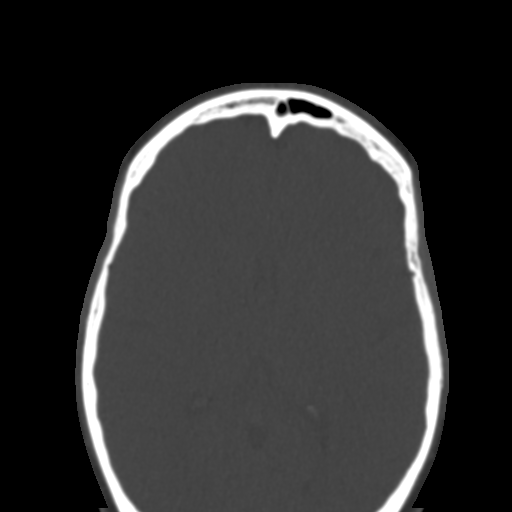
[im 107/115  bone]
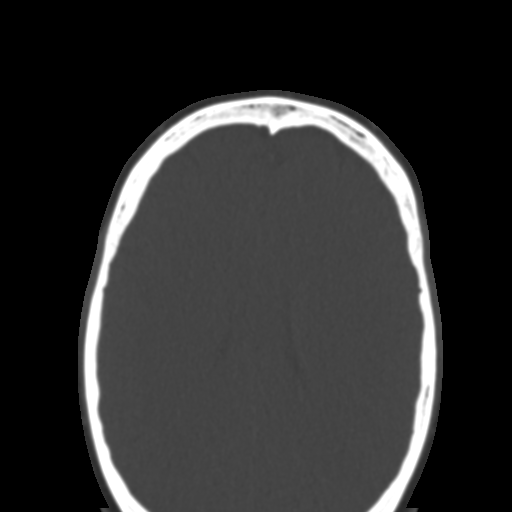

[Series 4: coronal · coronal · 0.23mm/px · 3 of 102 slices shown]
[im 34/102  bone]
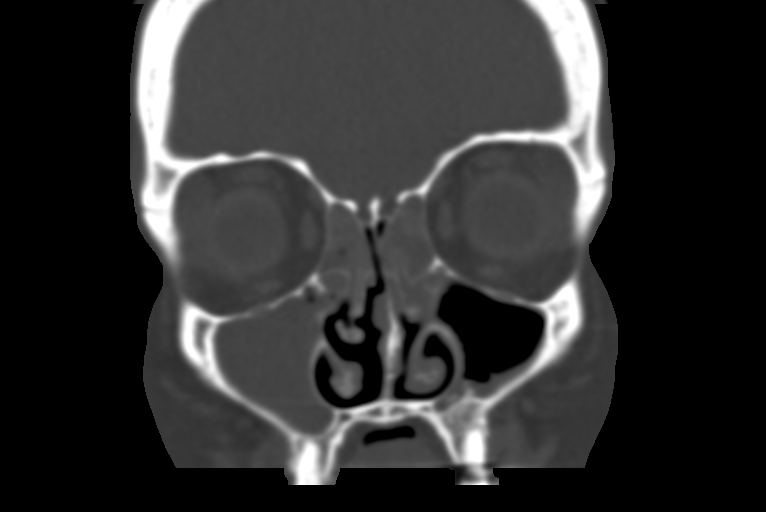
[im 45/102  bone]
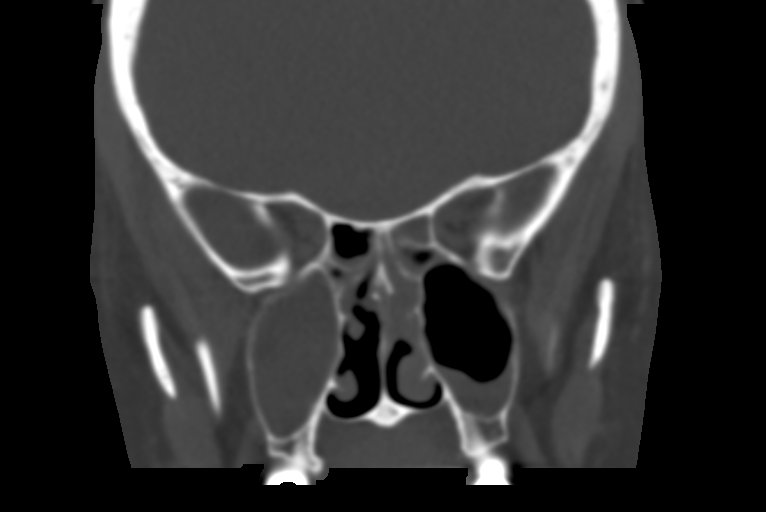
[im 57/102  bone]
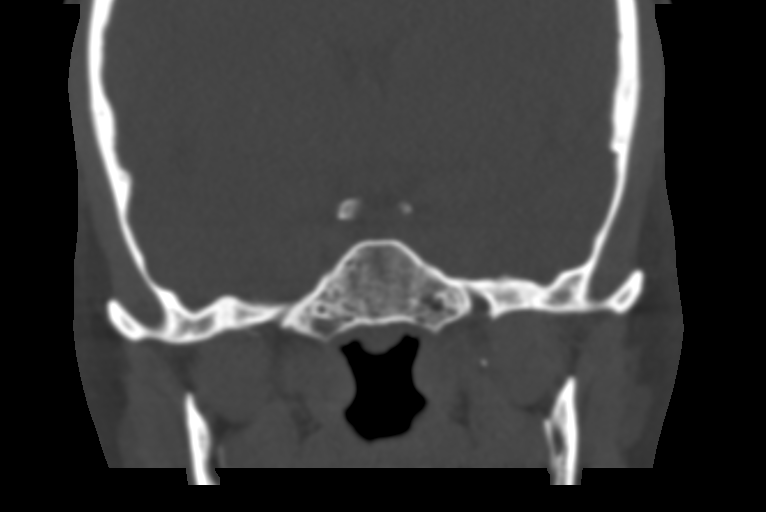

[Series 5: sagittal · sagittal · 0.26mm/px · 3 of 99 slices shown]
[im 33/99  bone]
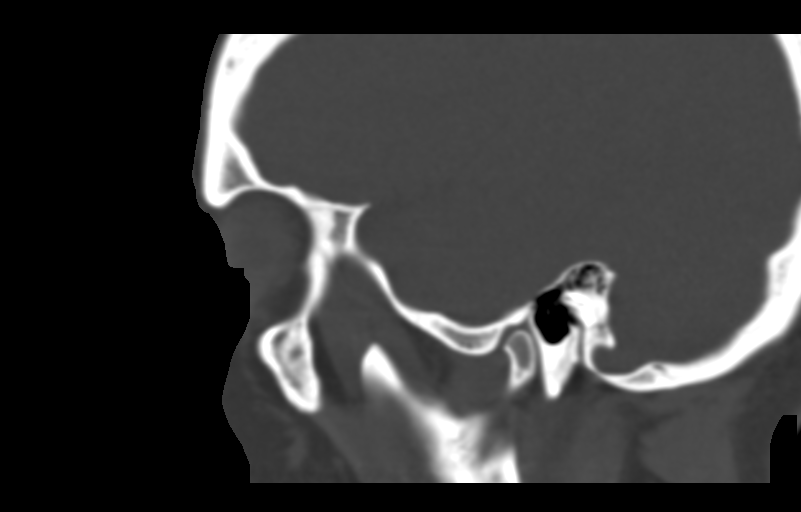
[im 50/99  bone]
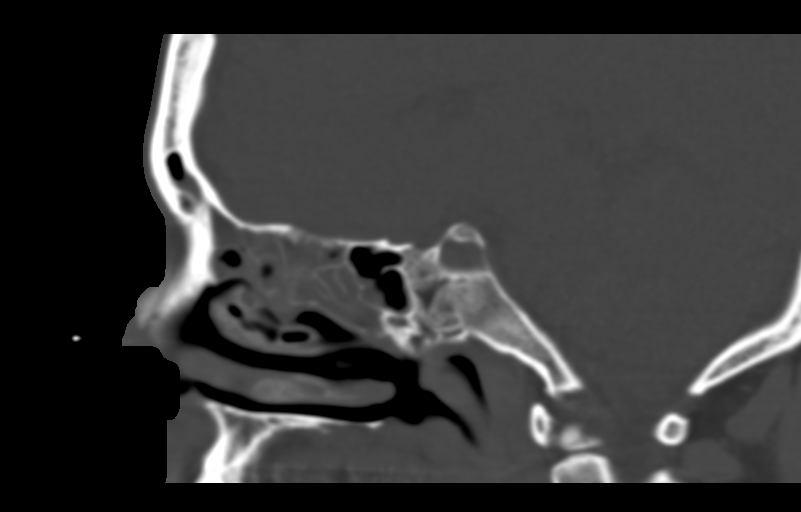
[im 66/99  bone]
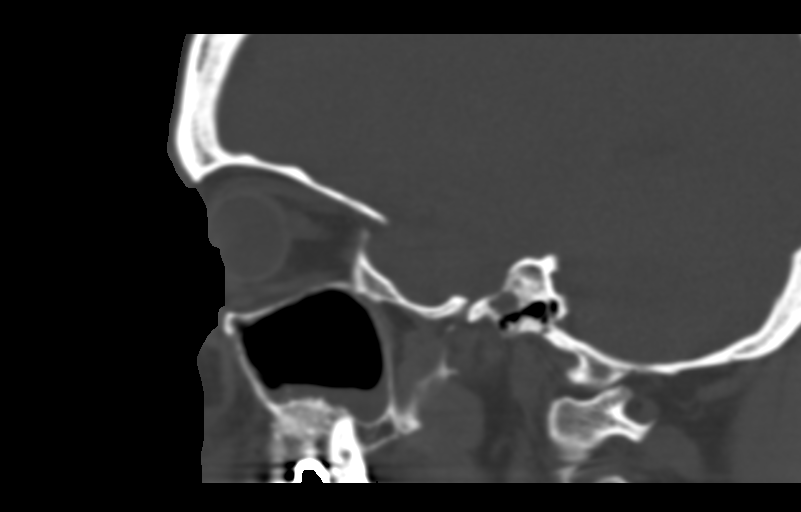

[14 of 47 positions shown; findings below may reference images not displayed]

FINDINGS: Paranasal sinuses:

Frontal: Partial opacification particularly near the occluded ostia.
Frontal recesses are occluded.

Ethmoid: Near-total opacification anteriorly and posteriorly.

Maxillary: Opacified right sinus with some aerosolized secretions.
Occluded right ostium. Circumferential mucosal thickening the left
with occluded ostium.

Sphenoid: Hypoplastic on the right with circumferential mucosal
thickening and occluded ostium. Circumferential mucosal thickening
with occluded ostium on the left. Occluded sphenoethmoidal recesses.

Right ostiomeatal unit: Occluded

Left ostiomeatal unit: Occluded

Nasal passages: Patchy opacification of the nasal cavity. Intact
nasal septum is deviated to the left.

Anatomy: Cribriform plate and fovea ethmoidalis appear intact and
slightly higher on the left lateral lamella measures up to 4 mm.
Presellar sphenoid pneumatization pattern. No dehiscence of carotid
or optic canals. Onodi cell on the right.

Other: Temporomandibular joints are unremarkable. Mastoid air cells
and middle ears are clear. Limited intracranial imaging demonstrates
no acute abnormality.
IMPRESSION: Significant paranasal sinus inflammatory changes with occlusion of
drainage pathway. Increased density of secretions may reflect
inspissation with superimposed fungal colonization of excluded.
Acute sinusitis is possible in the appropriate clinical setting.

## 2022-03-28 ENCOUNTER — Other Ambulatory Visit (HOSPITAL_COMMUNITY): Payer: Self-pay

## 2022-04-02 ENCOUNTER — Telehealth: Payer: Self-pay | Admitting: Internal Medicine

## 2022-04-02 ENCOUNTER — Other Ambulatory Visit (HOSPITAL_COMMUNITY): Payer: Self-pay

## 2022-04-02 MED ORDER — PREDNISONE 10 MG PO TABS
ORAL_TABLET | ORAL | 0 refills | Status: AC
  Filled 2022-04-02: qty 14, 6d supply, fill #0

## 2022-04-02 NOTE — Telephone Encounter (Signed)
Primary Pulmonologist: Dr. Melvyn Novas  ?Last office visit and with whom: 09/12/2021 Dr. Melvyn Novas  ?What do we see them for (pulmonary problems): cough variant asthma ?Last OV assessment/plan: see below ? ?Was appointment offered to patient (explain)?  No none available in [facility]  ? ? ?Reason for call: Patient was Covid + on March 20, 2022. Coughing still.  Dry cough. No drainage or congestion. Using Advair, Singulair and dupixent. No fevers. She states last time she had covid, she had an ongoing cough that she came to Dr. Melvyn Novas for and he helped her. Would like to have prednisone sent in to pharmacy to try taking that and see if it helps her.  ? ?Dr. Melvyn Novas please advise.  ? ?No Known Allergies ? ?Immunization History  ?Administered Date(s) Administered  ? Influenza Inj Mdck Quad Pf 10/04/2019  ? Influenza, Seasonal, Injecte, Preservative Fre 09/06/2021  ? Influenza-Unspecified 09/04/2016  ? Tdap 07/04/2011  ?  ?Assessment:  ?   ?  ?    ?  ? ? Assessment & Plan Note by Tanda Rockers, MD at 09/12/2021 10:21 AM ? ?Author: Tanda Rockers, MD Author Type: Physician Filed: 09/12/2021 10:23 AM  ?Note Status: Written Cosign: Cosign Not Required Encounter Date: 09/12/2021  ?Problem: Upper airway cough syndrome  ?Editor: Tanda Rockers, MD (Physician)      ?       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 Increased density of secretions may reflect ?inspissation with superimposed fungal colonization of excluded. ?-Sinus surgery Apr 30 2020  Teoh / polyps ?- 05/16/20 d/c bcps > cough resolved  ?Sinus surgery May 17 2021Teoh / polyps ?  ?Cough now is likely  due to advair more than sinus dz but hard to be sure.  Clearly the nasal obstruction assoc with the cough though is coming from the same process that has caused the polyps and requires multiple courses of prednisone since polyp surgery so good candidate for Dupixent if can get qualified for it. ?  ?>>> referred back to Dr [name] / f/u here yearly for refills on inhalers or can get these also from Dr Darnell Level ?  ?    ?  ?  ?Each maintenance medication was reviewed in detail including emphasizing most importantly the difference between maintenance and prns and under what circumstances the prns are to be triggered using an action plan format where appropriate. ?  ?Total time for H and P, chart review, counseling, reviewing dpi/nasal device(s) and generating customized AVS unique to this office visit / same day charting = 40mn  ?     ?  ?  ? ? ? Assessment & Plan Note by WTanda Rockers MD at 09/12/2021 10:20 AM ? 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  ?All goals of chronic asthma control met including optimal function and elimination  of symptoms with minimal need for rescue therapy. ?  ?Contingencies discussed in full including contacting this office immediately if not controlling the symptoms using the rule of two's.    ?  ?  ?  ?  ? ? ? Patient Instructions by Tanda Rockers, MD at 09/12/2021 9:45 AM ? ?Author: Tanda Rockers, MD Author Type: Physician Filed: 09/12/2021 10:09 AM  ?Note Status: Signed Cosign: Cosign Not Required Encounter Date: 09/12/2021  ?Editor: Tanda Rockers, MD (Physician)      ?       ?You need to see Dr [name] about your nasal obstruction that responds so convincingly to prednisone because you are good candidate for Dupixent or one of the other biologics ?  ?Please schedule a follow up visit in 12 months but call sooner if needed in [place] office  ?  ?  ? ? ?Orthostatic Vitals Recorded in This Encounter ? ? 09/12/2021  ?[id number]     ?BP Location: Left Arm  ?Cuff Size: Normal  ? ?

## 2022-04-02 NOTE — Telephone Encounter (Signed)
Sorry to hear that :  try Prednisone 10 mg take  4 each am x 2 days,   2 each am x 2 days,  1 each am x 2 days and stop  ? ?My last note says I turned her over to Dr [name] but happy to see back if not maintaining good chronic control of resp symptoms  - must return with all meds in hand to regroup if this is the case.  ?

## 2022-04-02 NOTE — Telephone Encounter (Signed)
Called and went over instructions with patient. She voiced understanding. Prednisone sent to preferred pharmacy. Nothing further needed.  ?

## 2022-04-07 ENCOUNTER — Other Ambulatory Visit (HOSPITAL_COMMUNITY): Payer: Self-pay

## 2022-04-16 ENCOUNTER — Encounter: Payer: Self-pay | Admitting: Allergy & Immunology

## 2022-04-16 ENCOUNTER — Other Ambulatory Visit: Payer: Self-pay

## 2022-04-16 ENCOUNTER — Ambulatory Visit: Payer: 59 | Admitting: Allergy & Immunology

## 2022-04-16 VITALS — BP 122/86 | HR 80 | Temp 97.9°F

## 2022-04-16 DIAGNOSIS — J339 Nasal polyp, unspecified: Secondary | ICD-10-CM

## 2022-04-16 DIAGNOSIS — J3089 Other allergic rhinitis: Secondary | ICD-10-CM | POA: Diagnosis not present

## 2022-04-16 DIAGNOSIS — J4540 Moderate persistent asthma, uncomplicated: Secondary | ICD-10-CM | POA: Diagnosis not present

## 2022-04-16 DIAGNOSIS — J302 Other seasonal allergic rhinitis: Secondary | ICD-10-CM

## 2022-04-16 DIAGNOSIS — J45998 Other asthma: Secondary | ICD-10-CM | POA: Diagnosis not present

## 2022-04-16 MED ORDER — GUAIFENESIN-CODEINE 100-10 MG/5ML PO SOLN: 10.0000 mL | Freq: Three times a day (TID) | ORAL | 0 refills | Status: DC | PRN

## 2022-04-16 NOTE — Progress Notes (Signed)
? ?FOLLOW UP ? ?Date of Service/Encounter:  04/16/22 ? ? ?Assessment:  ? ?Moderate persistent asthma, uncomplicated - with >10 rounds of prednisone in the last 18 months, markedly improved on Dupixent ? ?Recent COVID-19 infection with worsening coughing ?  ?Seasonal and perennial allergic rhinitis (mouse, [place] pig, weeds, grasses, outdoor molds and dust mites) ?  ?Nasal polyposis - s/p polypectomy ? ?Plan/Recommendations:  ? ?1. Moderate persistent asthma, uncomplicated ?- Lung testing looks EXCELLENT today!  ?- Stop the Advair and start Breztri two puffs twice daily (Symbicort plus another medicine to help with keeping her lungs open). ?- Script for prescription cough medicine provided today (fill if you get desperate). ?- Use albuterol 4 puffs before bedtime to see if this can help the coughing at night.  ?- Spacer sample and demonstration provided.  ?- Daily controller medication(s): Singulair 10mg  daily and Breztri two puffs twice daily with spacer and Dupixent 300mg  every two weeks ?- Prior to physical activity: albuterol 2 puffs 10-15 minutes before physical activity. ?- Rescue medications: albuterol 4 puffs every 4-6 hours as needed ?- Asthma control goals:  ?* Full participation in all desired activities (may need albuterol before activity) ?* Albuterol use two time or less a week on average (not counting use with activity) ?* Cough interfering with sleep two time or less a month ?* Oral steroids no more than once a year ?* No hospitalizations ? ?2. Seasonal and perennial allergic rhinitis (mouse, pig, weeds, grasses, outdoor molds and dust mites) ?- Continue levocetirizine 5 mg daily. ?- Continue with montelukast 10mg  daily. ?- Continue Xhance one spray per nostril twice daily  ? ?3. Nasal polyposis ?- Continue Dupixent as above ?- Continue Xhance one spray per nostril twice daily. ? ?4. Follow up as scheduled.  ? ? ?Subjective:  ? ?Sara Hamilton is a 36 y.o. female presenting today for follow up  of  ?Chief Complaint  ?Patient presents with  ? Cough  ?  Covid around April 7, coughing since April. Dry cough, taking medications as given  ? ? ?[name] has a history of the following: ?Patient Active Problem List  ? Diagnosis Date Noted  ? Nasal polyposis 10/02/2021  ? Encounter for gynecological examination with Papanicolaou smear of cervix 08/07/2021  ? 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 Upper airway cough syndrome 05/01/2018  ? Seasonal and perennial allergic rhinitis 04/28/2018  ? Moderate persistent asthma, uncomplicated 04/28/2018  ? Contraceptive management 11/07/2013  ? Pregnancy, supervision of normal 07/02/2011  ? ? ?History obtained from: chart review and patient. ? ?Sara Hamilton is a 36 y.o. female presenting for a follow up visit.  She was last seen in February 2023 by 07/04/2011, one of our nurse practitioners.  At that time, she had made the decision to start Dupixent.  She remained on it 300 mg every 2 weeks.  She also remained on Advair 100 mcg 1 puff twice daily as well as Singulair 10 mg daily.  For her allergic rhinitis, we continue with the levocetirizine as well as montelukast.  She was also continued on Xhance 1 spray per nostril twice daily. ? ?She had COVID in April and has had consistent coughing.  This is what brings her in today.  She is worried that her asthma is becoming less controlled since having the COVID-19.  This is the second time that she had COVID-19. She did get prednisone from Dr. March 2023 and this helped. But then the cough returned.  This gets especially bad at night. She is not having chest pain or fever or any other issues.  She is not having any fever with this.  She thinks she is overall out of the right way and she does feel good knowing that her spirometry is normal today. ? ?ACT is 18. She was placed on prednisone in April 2023. She got it when she went to [place] with her son's class. A lot of people on the trip got it. She was out of  work for an extended period of time due to the lingering cough. This was not as bad as before. She got it a couple of years ago too. Cough has slowly improved.  She has been using some hard candies to try to tamp down the cough.  She has never been on Spiriva. ? ?She remains on the Dupixent. She remains on the Advair. She has only ever been on Symbicort and then Advair. Her insurance stopped covering the Symbicort as well which is why she changed to the Advair.  ? ?Otherwise, there have been no changes to her past medical history, surgical history, family history, or social history. ? ? ? ?Review of Systems  ?Constitutional: Negative.  Negative for chills, fever, malaise/fatigue and weight loss.  ?HENT: Negative.  Negative for congestion, ear discharge and ear pain.   ?Eyes:  Negative for pain, discharge and redness.  ?Respiratory:  Positive for cough. Negative for sputum production, shortness of breath and wheezing.   ?Cardiovascular: Negative.  Negative for chest pain and palpitations.  ?Gastrointestinal:  Negative for abdominal pain, diarrhea, heartburn, nausea and vomiting.  ?Skin: Negative.  Negative for itching and rash.  ?Neurological:  Negative for dizziness and headaches.  ?Endo/Heme/Allergies:  Negative for environmental allergies. Does not bruise/bleed easily.   ? ? ? ?Objective:  ? ?Blood pressure 122/86, pulse 80, temperature 97.9 ?F (36.6 ?C), SpO2 100 %. ?There is no height or weight on file to calculate BMI. ? ? ? ?Physical Exam ?Vitals reviewed.  ?Constitutional:   ?   Appearance: She is well-developed.  ?HENT:  ?   Head: Normocephalic and atraumatic.  ?   Right Ear: Tympanic membrane, ear canal and external ear normal. No drainage, swelling or tenderness. Tympanic membrane is not injected, scarred, erythematous, retracted or bulging.  ?   Left Ear: Tympanic membrane, ear canal and external ear normal. No drainage, swelling or tenderness. Tympanic membrane is not injected, scarred, erythematous,  retracted or bulging.  ?   Nose: No nasal deformity, septal deviation, mucosal edema or rhinorrhea.  ?   Right Turbinates: Enlarged, swollen and pale.  ?   Left Turbinates: Enlarged, swollen and pale.  ?   Right Sinus: No maxillary sinus tenderness or frontal sinus tenderness.  ?   Left Sinus: No maxillary sinus tenderness or frontal sinus tenderness.  ?   Comments: I did not see any nasal polyps, but there were certainly some post surgical changes present. No epistaxis. Turbinates enlarged and somewhat difficult to see past.  ?   Mouth/Throat:  ?   Mouth: Mucous membranes are not pale and not dry.  ?   Pharynx: Uvula midline.  ?Eyes:  ?   General:     ?   Right eye: No discharge.     ?   Left eye: No discharge.  ?   Conjunctiva/sclera: Conjunctivae normal.  ?   Right eye: Right conjunctiva is not injected. No chemosis. ?   Left eye: Left conjunctiva is not  injected. No chemosis. ?   Pupils: Pupils are equal, round, and reactive to light.  ?Cardiovascular:  ?   Rate and Rhythm: Normal rate and regular rhythm.  ?   Heart sounds: Normal heart sounds.  ?Pulmonary:  ?   Effort: Pulmonary effort is normal. No tachypnea, accessory muscle usage or respiratory distress.  ?   Breath sounds: Normal breath sounds. No wheezing, rhonchi or rales.  ?   Comments: Moving air well in all lung fields. No increased work of breathing noted. No increased work of breathing noted.  Intermittent coughing during the visit. ?Chest:  ?   Chest wall: No tenderness.  ?Abdominal:  ?   Tenderness: There is no abdominal tenderness. There is no guarding or rebound.  ?Lymphadenopathy:  ?   Head:  ?   Right side of head: No submandibular, tonsillar or occipital adenopathy.  ?   Left side of head: No submandibular, tonsillar or occipital adenopathy.  ?   Cervical: No cervical adenopathy.  ?Skin: ?   General: Skin is warm.  ?   Capillary Refill: Capillary refill takes less than 2 seconds.  ?   Coloration: Skin is not pale.  ?   Findings: No abrasion,  erythema, petechiae or rash. Rash is not papular, urticarial or vesicular.  ?Neurological:  ?   Mental Status: She is alert.  ?  ? ?Diagnostic studies:   ? ?Spirometry: results normal (FEV1: 2.90/88%, FVC

## 2022-04-16 NOTE — Patient Instructions (Addendum)
1. Moderate persistent asthma, uncomplicated ?- Lung testing looks EXCELLENT today!  ?- Stop the Advair and start Breztri two puffs twice daily (Symbicort plus another medicine to help with keeping her lungs open). ?- Script for prescription cough medicine provided today (fill if you get desperate). ?- Use albuterol 4 puffs before bedtime to see if this can help the coughing at night.  ?- Spacer sample and demonstration provided.  ?- Daily controller medication(s): Singulair 10mg  daily and Breztri two puffs twice daily with spacer and Dupixent 300mg  every two weeks ?- Prior to physical activity: albuterol 2 puffs 10-15 minutes before physical activity. ?- Rescue medications: albuterol 4 puffs every 4-6 hours as needed ?- Asthma control goals:  ?* Full participation in all desired activities (may need albuterol before activity) ?* Albuterol use two time or less a week on average (not counting use with activity) ?* Cough interfering with sleep two time or less a month ?* Oral steroids no more than once a year ?* No hospitalizations ? ?2. Seasonal and perennial allergic rhinitis (mouse, [place] pig, weeds, grasses, outdoor molds and dust mites) ?- Continue levocetirizine 5 mg daily. ?- Continue with montelukast 10mg  daily. ?- Continue Xhance one spray per nostril twice daily  ? ?3. Nasal polyposis ?- Continue Dupixent as above ?- Continue Xhance one spray per nostril twice daily. ? ?4. Follow up as scheduled.  ?

## 2022-04-17 ENCOUNTER — Other Ambulatory Visit (HOSPITAL_COMMUNITY): Payer: Self-pay

## 2022-04-17 ENCOUNTER — Telehealth: Payer: Self-pay

## 2022-04-17 MED ORDER — XHANCE 93 MCG/ACT NA EXHU
2.0000 | INHALANT_SUSPENSION | Freq: Two times a day (BID) | NASAL | 5 refills | Status: DC | PRN
  Filled 2022-04-17: qty 16, 30d supply, fill #0

## 2022-04-17 MED ORDER — BREZTRI AEROSPHERE 160-9-4.8 MCG/ACT IN AERO
2.0000 | INHALATION_SPRAY | Freq: Two times a day (BID) | RESPIRATORY_TRACT | 5 refills | Status: DC
  Filled 2022-04-17: qty 10.7, 30d supply, fill #0
  Filled 2022-05-13: qty 10.7, 30d supply, fill #1
  Filled 2022-06-08: qty 10.7, 30d supply, fill #2
  Filled 2022-07-12: qty 10.7, 30d supply, fill #3
  Filled 2022-08-07: qty 10.7, 30d supply, fill #4
  Filled 2022-09-02: qty 10.7, 30d supply, fill #5

## 2022-04-17 MED ORDER — MONTELUKAST SODIUM 10 MG PO TABS
10.0000 mg | ORAL_TABLET | Freq: Every day | ORAL | 5 refills | Status: DC
  Filled 2022-04-17: qty 30, 30d supply, fill #0
  Filled 2022-06-30: qty 90, 90d supply, fill #0
  Filled 2022-10-06: qty 90, 90d supply, fill #1

## 2022-04-17 MED ORDER — ALBUTEROL SULFATE HFA 108 (90 BASE) MCG/ACT IN AERS
2.0000 | INHALATION_SPRAY | Freq: Four times a day (QID) | RESPIRATORY_TRACT | 1 refills | Status: DC | PRN
  Filled 2022-04-17: qty 18, 25d supply, fill #0
  Filled 2023-03-06: qty 6.7, 25d supply, fill #1

## 2022-04-17 MED ORDER — LEVOCETIRIZINE DIHYDROCHLORIDE 5 MG PO TABS
5.0000 mg | ORAL_TABLET | Freq: Every evening | ORAL | 5 refills | Status: DC
  Filled 2022-04-17: qty 30, 30d supply, fill #0
  Filled 2022-05-13: qty 30, 30d supply, fill #1
  Filled 2022-06-08: qty 30, 30d supply, fill #2
  Filled 2022-06-30: qty 30, 30d supply, fill #3
  Filled 2022-08-07: qty 30, 30d supply, fill #4
  Filled 2022-09-02: qty 30, 30d supply, fill #5

## 2022-04-17 NOTE — Telephone Encounter (Signed)
Called patient - DOB verified -  advised all medications refills have been electronically sent [facility]. Patient verbalized understanding, no further questions. ?

## 2022-04-17 NOTE — Telephone Encounter (Signed)
Patient was seen on yesterday and she was told to start on Breztri. Patient went to the pharmacy and this medication wasn't there. ? ?[facility]  ?

## 2022-04-18 ENCOUNTER — Other Ambulatory Visit (HOSPITAL_COMMUNITY): Payer: Self-pay

## 2022-04-18 NOTE — Addendum Note (Signed)
Addended by: [name] on: 04/18/2022 05:13 PM ? ? Modules accepted: Orders ? ?

## 2022-04-29 ENCOUNTER — Other Ambulatory Visit (HOSPITAL_COMMUNITY): Payer: Self-pay

## 2022-05-05 ENCOUNTER — Other Ambulatory Visit (HOSPITAL_COMMUNITY): Payer: Self-pay

## 2022-05-13 ENCOUNTER — Other Ambulatory Visit (HOSPITAL_COMMUNITY): Payer: Self-pay

## 2022-05-21 ENCOUNTER — Other Ambulatory Visit: Payer: Self-pay

## 2022-05-21 MED ORDER — XHANCE 93 MCG/ACT NA EXHU: 2.0000 | INHALANT_SUSPENSION | Freq: Two times a day (BID) | NASAL | 5 refills | Status: DC | PRN

## 2022-05-23 IMAGING — DX DG CHEST 2V
2 series · 2 of 2 positions shown · non-contrast
Comparison: 03/15/2018

CLINICAL DATA: Cough

EXAM:
CHEST - 2 VIEW

[chest pa]
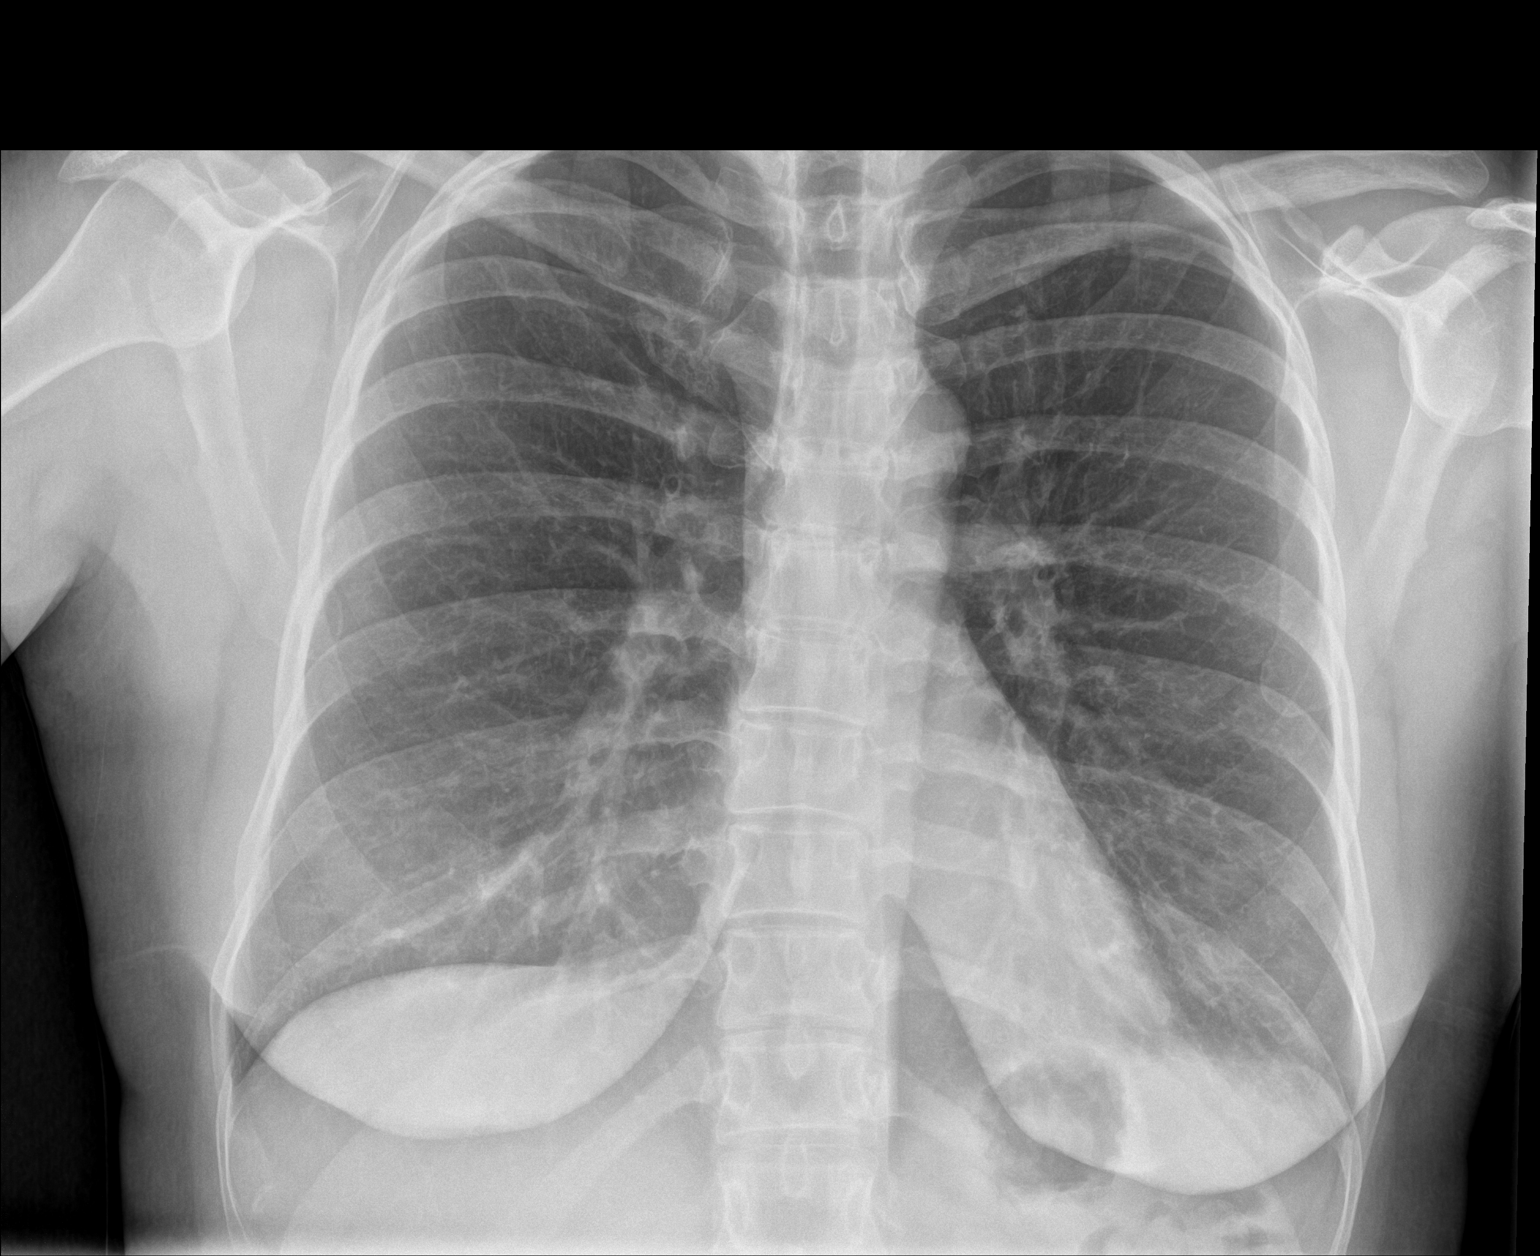

[chest lat]
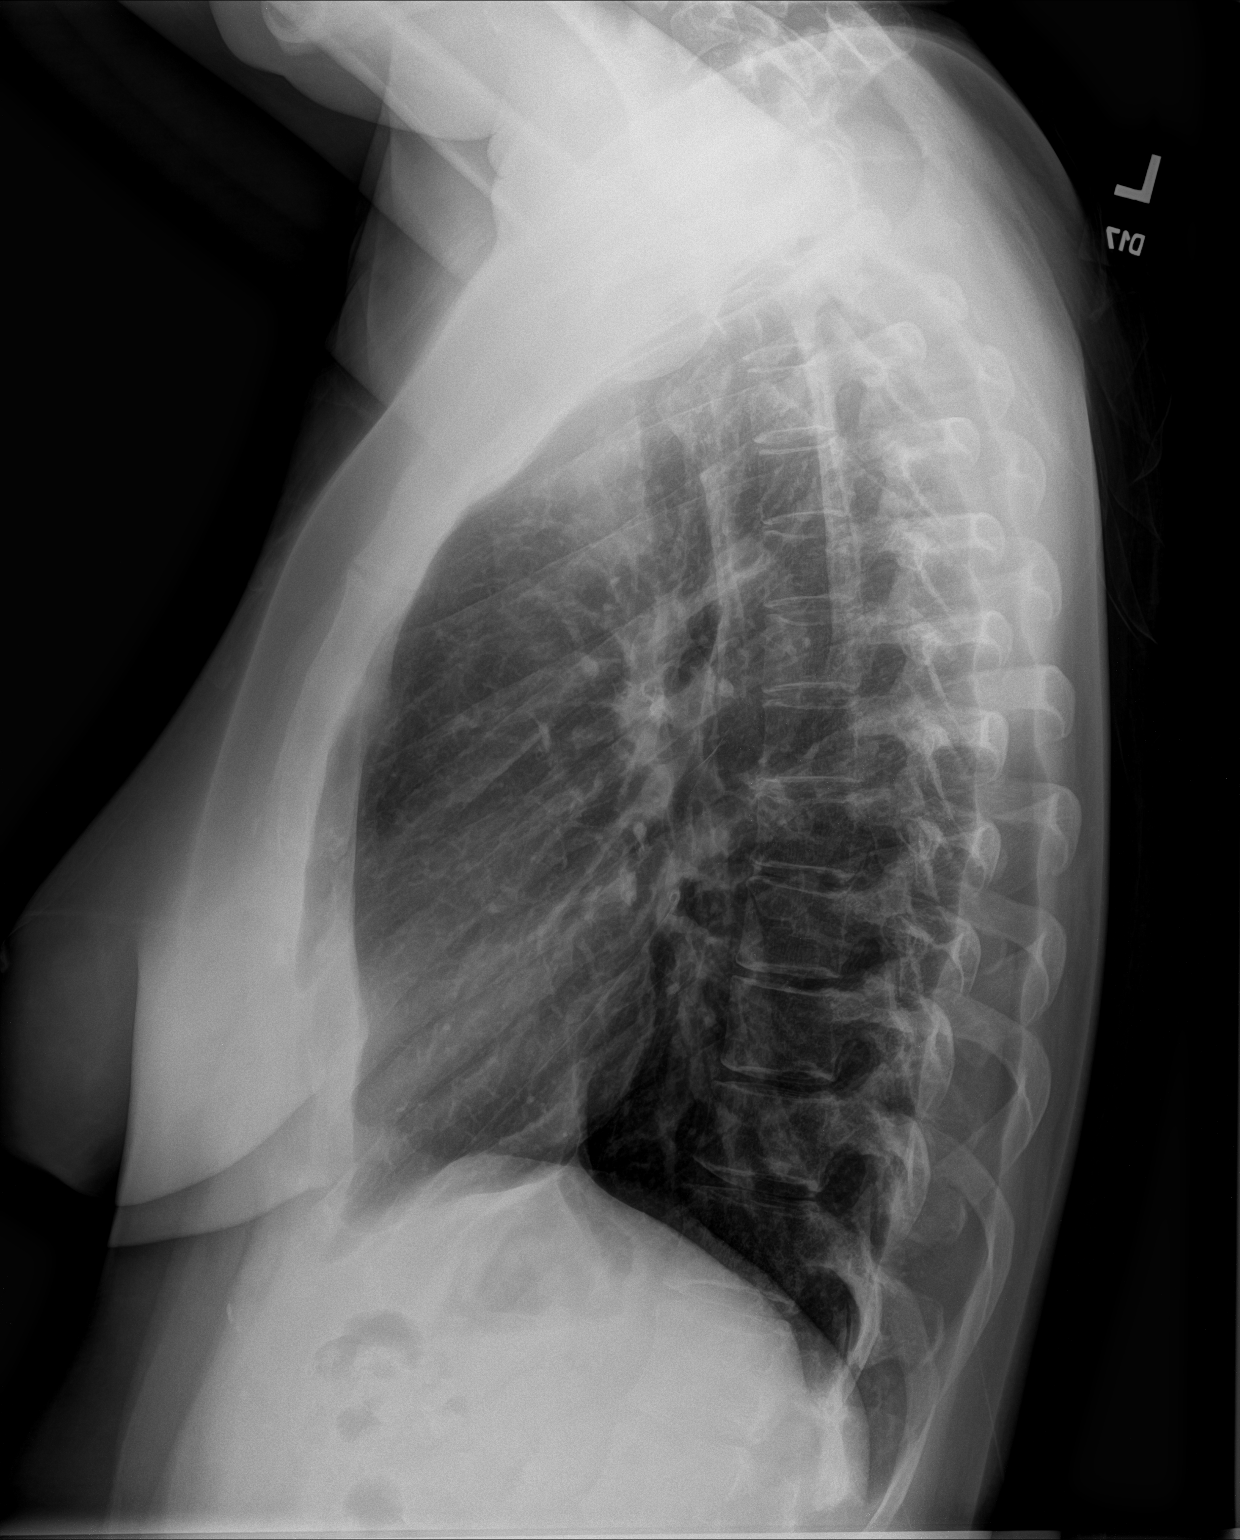

[2 of 2 positions shown; findings below may reference images not displayed]

FINDINGS: The heart size and mediastinal contours are within normal limits.
Both lungs are clear. The visualized skeletal structures are
unremarkable.
IMPRESSION: No active cardiopulmonary disease.

## 2022-05-27 ENCOUNTER — Other Ambulatory Visit (HOSPITAL_COMMUNITY): Payer: Self-pay

## 2022-06-04 ENCOUNTER — Other Ambulatory Visit (HOSPITAL_COMMUNITY): Payer: Self-pay

## 2022-06-09 ENCOUNTER — Other Ambulatory Visit (HOSPITAL_COMMUNITY): Payer: Self-pay

## 2022-06-19 DIAGNOSIS — L918 Other hypertrophic disorders of the skin: Secondary | ICD-10-CM | POA: Diagnosis not present

## 2022-06-19 DIAGNOSIS — L818 Other specified disorders of pigmentation: Secondary | ICD-10-CM | POA: Diagnosis not present

## 2022-06-19 DIAGNOSIS — L700 Acne vulgaris: Secondary | ICD-10-CM | POA: Diagnosis not present

## 2022-06-26 ENCOUNTER — Other Ambulatory Visit (HOSPITAL_COMMUNITY): Payer: Self-pay

## 2022-06-30 ENCOUNTER — Other Ambulatory Visit (HOSPITAL_COMMUNITY): Payer: Self-pay

## 2022-07-01 ENCOUNTER — Other Ambulatory Visit (HOSPITAL_COMMUNITY): Payer: Self-pay

## 2022-07-01 NOTE — Patient Instructions (Incomplete)
1. Moderate persistent asthma -Consider decreasing back to Advair if doing well at your next office visit. -Let us know if you would like to change to Trelegy to see if this makes a difference in your facial acne. My suspicion for Markus Daft being the cause for facial acne is low - Daily controller medication(s): Singulair 10mg  daily and Breztri two puffs twice daily with spacer and Dupixent 300mg  every two weeks - Prior to physical activity: albuterol 2 puffs 10-15 minutes before physical activity. - Rescue medications: albuterol 4 puffs every 4-6 hours as needed - Asthma control goals:  * Full participation in all desired activities (may need albuterol before activity) * Albuterol use two time or less a week on average (not counting use with activity) * Cough interfering with sleep two time or less a month * Oral steroids no more than once a year * No hospitalizations  2. Seasonal and perennial allergic rhinitis (mouse, [place] pig, weeds, grasses, outdoor molds and dust mites) - Continue levocetirizine 5 mg daily. - Continue with montelukast 10mg  daily. - Continue Xhance one spray per nostril twice daily   3. Nasal polyposis - Continue Dupixent as above - Continue Xhance one spray per nostril twice daily.  4. Follow up 3 months or sooner if needed.

## 2022-07-02 ENCOUNTER — Encounter: Payer: Self-pay | Admitting: Family

## 2022-07-02 ENCOUNTER — Ambulatory Visit: Payer: 59 | Admitting: Family

## 2022-07-02 VITALS — BP 122/76 | HR 78 | Temp 98.5°F | Resp 18 | Ht 66.0 in | Wt 187.0 lb

## 2022-07-02 DIAGNOSIS — J3089 Other allergic rhinitis: Secondary | ICD-10-CM | POA: Diagnosis not present

## 2022-07-02 DIAGNOSIS — J4540 Moderate persistent asthma, uncomplicated: Secondary | ICD-10-CM

## 2022-07-02 DIAGNOSIS — J339 Nasal polyp, unspecified: Secondary | ICD-10-CM

## 2022-07-02 DIAGNOSIS — J302 Other seasonal allergic rhinitis: Secondary | ICD-10-CM

## 2022-07-02 NOTE — Progress Notes (Signed)
[address] [place] [address] Dept: [phone]  FOLLOW UP NOTE  Patient ID: Sara Hamilton, female    DOB: [birth year]  Age: 36 y.o. MRN: [id number] Date of Office Visit: 07/02/2022  Assessment  Chief Complaint: Asthma (Asthma is doing well. Since the change to breztri she notices acne breakout all over the face. Act score:22) and Allergic Rhinitis  (Allergies are doing well. )  HPI Sara Hamilton is a 35 year old female who presents today for follow-up of moderate persistent asthma, recent COVID-19 infection with worsening coughing, seasonal and perennial allergic rhinitis, and nasal polyposis-status post polypectomy.  She denies any new diagnosis or surgery since her last office visit.  Moderate persistent asthma is reported as controlled with Breztri 2 puffs twice a day with spacer, Singulair 10 mg once a day, and Dupixent 300 mg every 2 weeks.  She reports that she gives her Dupixent injections at home and does not have any problems.  Her last Dupixent injection was this last Tuesday.  She denies coughing, wheezing, tightness in chest, shortness of breath, and nocturnal awakenings due to breathing problems.  Since her last office visit she has not required any systemic steroids or made any trips to the emergency room or urgent care due to breathing problems.  She is now back to normal and is not having to use her albuterol at all now.  She reports that since starting Breztri she has had more facial acne than normal.  She typically will get acne during her menstrual cycle, but this has been more constant.  She did see her dermatologist and reports that she felt acne was more due to hormones due to its location rather than Breztri being the cause.  Offered to try Trelegy in Breztri's place and she would like to continue with [facility] for now.  Seasonal and perennial allergic rhinitis: She is currently taking levocetirizine 5 mg at night, Singulair 10 mg at night, and Xhance 2  sprays each nostril at night.  She reports a little bit of nasal congestion that she attributes to occurring after being outside a lot yesterday.  She denies rhinorrhea and postnasal drip.  She has not had any sinus infections since we last saw her.  She thinks that Dr. Suszanne Conners did her sinus surgery in May 2021.   Drug Allergies:  No Known Allergies  Review of Systems: Review of Systems  Constitutional:  Negative for chills and fever.  HENT:         Reports a little bit of nasal congestion today otherwise denies rhinorrhea and postnasal drip  Eyes:        Denies itchy watery eyes  Respiratory:  Negative for cough, shortness of breath and wheezing.        Denies cough, wheeze, tightness in chest, shortness of breath and nocturnal awakenings due to breathing problems  Cardiovascular:  Negative for chest pain and palpitations.  Gastrointestinal:        Denies heartburn or reflux symptoms  Genitourinary:  Negative for frequency.  Skin:        Reports facial acne since starting Breztri  Neurological:  Negative for headaches.  Endo/Heme/Allergies:  Positive for environmental allergies.     Physical Exam: BP 122/76   Pulse 78   Temp 98.5 F (36.9 C)   Resp 18   Ht 5\' 6"  (1.676 m)   Wt 187 lb (84.8 kg)   SpO2 100%   BMI 30.18 kg/m    Physical Exam Constitutional:  Appearance: Normal appearance.  HENT:     Head: Normocephalic and atraumatic.     Comments: Pharynx normal, eyes normal, ears normal, nose normal    Right Ear: Tympanic membrane, ear canal and external ear normal.     Left Ear: Tympanic membrane, ear canal and external ear normal.     Mouth/Throat:     Mouth: Mucous membranes are moist.     Pharynx: Oropharynx is clear.  Eyes:     Conjunctiva/sclera: Conjunctivae normal.  Cardiovascular:     Rate and Rhythm: Normal rate and regular rhythm.     Heart sounds: Normal heart sounds.  Pulmonary:     Effort: Pulmonary effort is normal.     Breath sounds: Normal  breath sounds.     Comments: Lungs clear to auscultation Musculoskeletal:     Cervical back: Neck supple.  Skin:    General: Skin is warm.  Neurological:     Mental Status: She is alert and oriented to person, place, and time.  Psychiatric:        Mood and Affect: Mood normal.        Behavior: Behavior normal.        Thought Content: Thought content normal.        Judgment: Judgment normal.     Diagnostics: FVC 3.51 L (88%), FEV1 2.92 L (88%).  Predicted FVC 4.00 L, predicted FEV1 3.30 L.  Spirometry indicates normal respiratory function.  Assessment and Plan: 1. Moderate persistent asthma, uncomplicated   2. Seasonal and perennial allergic rhinitis   3. Nasal polyposis     No orders of the defined types were placed in this encounter.   Patient Instructions  1. Moderate persistent asthma -Consider decreasing back to Advair if doing well at your next office visit. -Let us know if you would like to change to Trelegy to see if this makes a difference in your facial acne. My suspicion for Markus Daft being the cause for facial acne is low - Daily controller medication(s): Singulair 10mg  daily and Breztri two puffs twice daily with spacer and Dupixent 300mg  every two weeks - Prior to physical activity: albuterol 2 puffs 10-15 minutes before physical activity. - Rescue medications: albuterol 4 puffs every 4-6 hours as needed - Asthma control goals:  * Full participation in all desired activities (may need albuterol before activity) * Albuterol use two time or less a week on average (not counting use with activity) * Cough interfering with sleep two time or less a month * Oral steroids no more than once a year * No hospitalizations  2. Seasonal and perennial allergic rhinitis (mouse, pig, weeds, grasses, outdoor molds and dust mites) - Continue levocetirizine 5 mg daily. - Continue with montelukast 10mg  daily. - Continue Xhance one spray per nostril twice daily   3. Nasal  polyposis - Continue Dupixent as above - Continue Xhance one spray per nostril twice daily.  4. Follow up 3 months or sooner if needed.  Return in about 3 months (around 10/02/2022), or if symptoms worsen or fail to improve.    Thank you for the opportunity to care for this patient.  Please do not hesitate to contact me with questions.  [place], FNP [facility]

## 2022-07-03 ENCOUNTER — Other Ambulatory Visit (HOSPITAL_COMMUNITY): Payer: Self-pay

## 2022-07-12 ENCOUNTER — Other Ambulatory Visit (HOSPITAL_COMMUNITY): Payer: Self-pay

## 2022-07-14 ENCOUNTER — Other Ambulatory Visit (HOSPITAL_COMMUNITY): Payer: Self-pay

## 2022-07-17 ENCOUNTER — Other Ambulatory Visit (HOSPITAL_COMMUNITY): Payer: Self-pay

## 2022-07-17 MED ORDER — SPIRONOLACTONE 50 MG PO TABS
50.0000 mg | ORAL_TABLET | Freq: Every day | ORAL | 3 refills | Status: DC
Start: 2022-06-19 — End: 2022-11-12
  Filled 2022-07-17: qty 30, 30d supply, fill #0
  Filled 2022-09-02: qty 30, 30d supply, fill #1
  Filled 2022-10-06: qty 30, 30d supply, fill #2

## 2022-07-23 DIAGNOSIS — H5203 Hypermetropia, bilateral: Secondary | ICD-10-CM | POA: Diagnosis not present

## 2022-07-28 ENCOUNTER — Other Ambulatory Visit (HOSPITAL_COMMUNITY): Payer: Self-pay

## 2022-07-28 MED ORDER — DOXYCYCLINE HYCLATE 100 MG PO CAPS
100.0000 mg | ORAL_CAPSULE | Freq: Two times a day (BID) | ORAL | 0 refills | Status: DC
  Filled 2022-07-28: qty 20, 10d supply, fill #0

## 2022-07-28 MED ORDER — MUPIROCIN 2 % EX OINT
1.0000 | TOPICAL_OINTMENT | Freq: Three times a day (TID) | CUTANEOUS | 0 refills | Status: DC
  Filled 2022-07-28: qty 22, 8d supply, fill #0

## 2022-07-30 ENCOUNTER — Other Ambulatory Visit (HOSPITAL_COMMUNITY): Payer: Self-pay

## 2022-08-07 ENCOUNTER — Other Ambulatory Visit (HOSPITAL_COMMUNITY): Payer: Self-pay

## 2022-08-21 ENCOUNTER — Other Ambulatory Visit (HOSPITAL_COMMUNITY): Payer: Self-pay

## 2022-09-01 ENCOUNTER — Other Ambulatory Visit (HOSPITAL_COMMUNITY): Payer: Self-pay

## 2022-09-02 ENCOUNTER — Other Ambulatory Visit (HOSPITAL_COMMUNITY): Payer: Self-pay

## 2022-09-12 ENCOUNTER — Ambulatory Visit: Payer: 59 | Admitting: Internal Medicine

## 2022-09-18 ENCOUNTER — Other Ambulatory Visit (HOSPITAL_COMMUNITY): Payer: Self-pay

## 2022-09-30 ENCOUNTER — Other Ambulatory Visit (HOSPITAL_COMMUNITY): Payer: Self-pay

## 2022-10-01 ENCOUNTER — Ambulatory Visit: Payer: 59 | Admitting: Family

## 2022-10-06 ENCOUNTER — Other Ambulatory Visit: Payer: Self-pay | Admitting: Allergy & Immunology

## 2022-10-06 ENCOUNTER — Other Ambulatory Visit (HOSPITAL_COMMUNITY): Payer: Self-pay

## 2022-10-06 MED ORDER — LEVOCETIRIZINE DIHYDROCHLORIDE 5 MG PO TABS
5.0000 mg | ORAL_TABLET | Freq: Every evening | ORAL | 5 refills | Status: DC
  Filled 2022-10-06: qty 30, 30d supply, fill #0
  Filled 2022-11-11: qty 30, 30d supply, fill #1
  Filled 2022-12-10: qty 90, 90d supply, fill #2
  Filled 2023-03-06: qty 30, 30d supply, fill #3

## 2022-10-07 ENCOUNTER — Other Ambulatory Visit (HOSPITAL_COMMUNITY): Payer: Self-pay

## 2022-10-14 NOTE — Patient Instructions (Incomplete)
1. Moderate persistent asthma Stop Breztri - Daily controller medication(s): Singulair 10mg  daily and START Advair 250/50 one puff twice daily. Rinse mouth out after.  Let me know if your asthma symptoms worsen with this medication change. - Continue Dupixent 300mg  every two weeks - Prior to physical activity: albuterol 2 puffs 10-15 minutes before physical activity. - Rescue medications: albuterol 4 puffs every 4-6 hours as needed - Asthma control goals:  * Full participation in all desired activities (may need albuterol before activity) * Albuterol use two time or less a week on average (not counting use with activity) * Cough interfering with sleep two time or less a month * Oral steroids no more than once a year * No hospitalizations  2. Seasonal and perennial allergic rhinitis (mouse, [place] pig, weeds, grasses, outdoor molds and dust mites) - Continue levocetirizine 5 mg daily. - Continue with montelukast 10mg  daily. - Continue Xhance one spray per nostril twice daily   3. Nasal polyposis - Continue Dupixent as above - Continue Xhance one spray per nostril twice daily.  4. Follow up 2-3  months or sooner if needed.

## 2022-10-15 ENCOUNTER — Other Ambulatory Visit (HOSPITAL_COMMUNITY): Payer: Self-pay

## 2022-10-15 ENCOUNTER — Ambulatory Visit: Payer: 59 | Admitting: Family

## 2022-10-15 ENCOUNTER — Encounter: Payer: Self-pay | Admitting: Family

## 2022-10-15 VITALS — BP 114/82 | HR 71 | Temp 97.9°F | Resp 18 | Wt 188.2 lb

## 2022-10-15 DIAGNOSIS — J3089 Other allergic rhinitis: Secondary | ICD-10-CM | POA: Diagnosis not present

## 2022-10-15 DIAGNOSIS — J339 Nasal polyp, unspecified: Secondary | ICD-10-CM | POA: Diagnosis not present

## 2022-10-15 DIAGNOSIS — J4540 Moderate persistent asthma, uncomplicated: Secondary | ICD-10-CM

## 2022-10-15 DIAGNOSIS — J302 Other seasonal allergic rhinitis: Secondary | ICD-10-CM

## 2022-10-15 MED ORDER — ADVAIR DISKUS 250-50 MCG/ACT IN AEPB
1.0000 | INHALATION_SPRAY | Freq: Two times a day (BID) | RESPIRATORY_TRACT | 4 refills | Status: DC
  Filled 2022-10-15 – 2022-10-20 (×2): qty 60, 30d supply, fill #0
  Filled 2022-12-03: qty 60, 30d supply, fill #1
  Filled 2023-01-05: qty 60, 30d supply, fill #2

## 2022-10-15 MED ORDER — FLUTICASONE-SALMETEROL 250-50 MCG/ACT IN AEPB: INHALATION_SPRAY | RESPIRATORY_TRACT | 0 refills | Status: DC

## 2022-10-15 MED ORDER — FLUTICASONE-SALMETEROL 250-50 MCG/ACT IN AEPB
1.0000 | INHALATION_SPRAY | Freq: Two times a day (BID) | RESPIRATORY_TRACT | 4 refills | Status: DC
  Filled 2022-10-15: qty 60, 30d supply, fill #0

## 2022-10-15 MED ORDER — ADVAIR DISKUS 250-50 MCG/ACT IN AEPB: INHALATION_SPRAY | RESPIRATORY_TRACT | 0 refills | Status: DC

## 2022-10-15 NOTE — Progress Notes (Signed)
[address], [address] [place] [address] Dept: [phone]  FOLLOW UP NOTE  Patient ID: Sara Hamilton, female    DOB: 22-Jan-1986  Age: 36 y.o. MRN: [id number] Date of Office Visit: 10/15/2022  Assessment  Chief Complaint: Asthma and Allergic Rhinitis   HPI Sara Hamilton is a 36 year old female who presents today for follow-up of moderate persistent asthma, seasonal and perennial allergic rhinitis, and nasal polyposis.  She was last seen on July 02, 2022 by myself.  She denies any new diagnosis or surgery since her last office visit.  Moderate persistent asthma: She continues to take Singulair 10 mg once a day, Breztri 2 puffs twice a day with a spacer, and Dupixent 300 mg every 2 weeks.  She denies any problems or reactions with her Dupixent injections.  She does feel like they have helped.  She is due for her next Dupixent injection on November 6.  She does her Dupixent injections at home.  She denies any cough, wheeze, tightness in chest, shortness of breath, and nocturnal awakenings due to breathing problems.  Since her last office visit she has not required any systemic steroids or made any trips to the emergency room or urgent care due to breathing problems.  She has not had to use her albuterol inhaler any since her last office visit.  She does mention that her dermatologist feels like the facial acne is due to hormones and not her inhaler.  Seasonal and perennial allergic rhinitis: She continues to take Xyzal 5 mg at night, montelukast 10 mg once a day, and Xhance 1 spray each nostril twice a day.  She is able to taste and smell and denies rhinorrhea, nasal congestion, and postnasal drip.  She has not had any sinus infections since we last saw her.  Nasal polyposis: She continues to do Dupixent injections every 2 weeks and reports that they have helped her.  She is now able to taste and smell.  She also continues to use Xhance 1 spray each nostril twice a day.  Discussed proper  technique with Xhance nasal spray.   Drug Allergies:  No Known Allergies  Review of Systems: Review of Systems  Constitutional:  Negative for chills and fever.  HENT:         Denies rhinorrhea, nasal congestion, and postnasal drip.  She reports that she is able to taste and smell.  Eyes:        Denies itchy watery eyes  Respiratory:  Negative for cough, shortness of breath and wheezing.        Denies cough, wheeze, tightness in chest, nocturnal awakenings due to breathing problems, and shortness of breath  Cardiovascular:  Negative for chest pain and palpitations.  Gastrointestinal:        Reports reflux symptoms only if she does not take famotidine at night and omeprazole in the morning.  Genitourinary:  Negative for frequency.  Skin:  Negative for itching and rash.  Neurological:  Negative for headaches.  Endo/Heme/Allergies:  Positive for environmental allergies.     Physical Exam: BP 114/82   Pulse 71   Temp 97.9 F (36.6 C)   Resp 18   Wt 188 lb 4 oz (85.4 kg)   SpO2 97%   BMI 30.38 kg/m    Physical Exam Constitutional:      Appearance: Normal appearance.  HENT:     Head: Normocephalic and atraumatic.     Comments: Pharynx normal, eyes normal, ears normal, nose: Bilateral lower turbinates mildly edematous  and slightly erythematous with no drainage noted.  Slight irritation with a small amount of blood noted in left nostril on septum    Right Ear: Tympanic membrane, ear canal and external ear normal.     Left Ear: Tympanic membrane, ear canal and external ear normal.  Cardiovascular:     Rate and Rhythm: Normal rate and regular rhythm.     Heart sounds: Normal heart sounds.  Pulmonary:     Effort: Pulmonary effort is normal.     Breath sounds: Normal breath sounds.     Comments: Iungs clear to auscultation Musculoskeletal:     Cervical back: Neck supple.  Skin:    General: Skin is warm.  Neurological:     Mental Status: She is alert and oriented to person,  place, and time.  Psychiatric:        Mood and Affect: Mood normal.        Behavior: Behavior normal.        Thought Content: Thought content normal.        Judgment: Judgment normal.     Diagnostics: FVC 3.65 L (91%), FEV1 2.96 L (90%).  Predicted FVC 4.00 L, predicted FEV1 3.29 L.  Spirometry indicates normal respiratory function  Assessment and Plan: 1. Moderate persistent asthma, uncomplicated   2. Seasonal and perennial allergic rhinitis   3. Nasal polyposis     Meds ordered this encounter  Medications   DISCONTD: fluticasone-salmeterol (ADVAIR DISKUS) 250-50 MCG/ACT AEPB    Sig: Inhale 1 puff into the lungs twice a day to help prevent cough and wheeze.  Rinse mouth out afterwards to help prevent thrush    Dispense:  60 each    Refill:  0   DISCONTD: fluticasone-salmeterol (ADVAIR DISKUS) 250-50 MCG/ACT AEPB    Sig: Inhale 1 puff into the lungs 2 (two) times daily to help prevent cough and wheeze.  Rinse mouth out afterwards to help prevent thrush    Dispense:  60 each    Refill:  4   ADVAIR DISKUS 250-50 MCG/ACT AEPB    Sig: Inhale 1 puff into the lungs twice a day to help prevent cough and wheeze.  Rinse mouth out afterwards to help prevent thrush    Dispense:  60 each    Refill:  0   ADVAIR DISKUS 250-50 MCG/ACT AEPB    Sig: Inhale 1 puff into the lungs twice a day to help prevent cough and wheeze.  Rinse mouth out afterwards to help prevent thrush    Dispense:  60 each    Refill:  4    Patient Instructions  1. Moderate persistent asthma Stop Breztri - Daily controller medication(s): Singulair 10mg  daily and START Advair 250/50 one puff twice daily. Rinse mouth out after.  Let me know if your asthma symptoms worsen with this medication change. - Continue Dupixent 300mg  every two weeks - Prior to physical activity: albuterol 2 puffs 10-15 minutes before physical activity. - Rescue medications: albuterol 4 puffs every 4-6 hours as needed - Asthma control goals:  *  Full participation in all desired activities (may need albuterol before activity) * Albuterol use two time or less a week on average (not counting use with activity) * Cough interfering with sleep two time or less a month * Oral steroids no more than once a year * No hospitalizations  2. Seasonal and perennial allergic rhinitis (mouse, [place] pig, weeds, grasses, outdoor molds and dust mites) - Continue levocetirizine 5 mg daily. - Continue with montelukast  10mg  daily. - Continue Xhance one spray per nostril twice daily   3. Nasal polyposis - Continue Dupixent as above - Continue Xhance one spray per nostril twice daily.  4. Follow up 2-3  months or sooner if needed.   Return in about 3 months (around 01/15/2023), or if symptoms worsen or fail to improve.    Thank you for the opportunity to care for this patient.  Please do not hesitate to contact me with questions.  [name], FNP [facility]

## 2022-10-16 ENCOUNTER — Other Ambulatory Visit (HOSPITAL_COMMUNITY): Payer: Self-pay

## 2022-10-16 ENCOUNTER — Other Ambulatory Visit: Payer: Self-pay | Admitting: Allergy & Immunology

## 2022-10-17 ENCOUNTER — Ambulatory Visit: Payer: 59 | Attending: Internal Medicine | Admitting: Pharmacist

## 2022-10-17 ENCOUNTER — Other Ambulatory Visit (HOSPITAL_COMMUNITY): Payer: Self-pay

## 2022-10-17 DIAGNOSIS — Z79899 Other long term (current) drug therapy: Secondary | ICD-10-CM

## 2022-10-17 MED ORDER — DUPIXENT 300 MG/2ML ~~LOC~~ SOSY
300.0000 mg | PREFILLED_SYRINGE | SUBCUTANEOUS | 11 refills | Status: DC
  Filled 2022-10-17: qty 4, 28d supply, fill #0

## 2022-10-17 MED ORDER — DUPIXENT 300 MG/2ML ~~LOC~~ SOSY
300.0000 mg | PREFILLED_SYRINGE | SUBCUTANEOUS | 11 refills | Status: DC
  Filled 2022-10-17: qty 4, 28d supply, fill #0
  Filled 2022-11-18 – 2022-11-26 (×2): qty 4, 28d supply, fill #1
  Filled 2022-12-17 – 2022-12-19 (×2): qty 4, 28d supply, fill #2
  Filled 2023-01-13: qty 4, 28d supply, fill #3
  Filled 2023-02-17: qty 4, 28d supply, fill #4
  Filled 2023-03-18: qty 4, 28d supply, fill #5
  Filled 2023-04-14: qty 4, 28d supply, fill #6
  Filled 2023-05-08: qty 4, 28d supply, fill #7
  Filled 2023-06-10: qty 4, 28d supply, fill #8
  Filled 2023-07-07: qty 4, 28d supply, fill #9
  Filled 2023-08-04: qty 4, 28d supply, fill #10
  Filled 2023-08-31: qty 4, 28d supply, fill #11

## 2022-10-17 NOTE — Progress Notes (Signed)
   S: Patient presents for review of their specialty medication therapy.  Patient is currently taking Dupixent for asthma/nasal polyposis. Patient is managed by Dr. [name] for this.   Adherence: confirms.   Efficacy: tells me today that the medication is amazing! She is experiencing great efficacy with the [place].  Dosing: 600 mg subq x1 followed by 300 mg q14days  Dose adjustments: Renal: no dose adjustments (has not been studied) Hepatic: no dose adjustments (has not been studied)  Drug-drug interactions: none identified   Screening: TB test: completed   Monitoring: S/sx of infection: none S/sx of hypersensitivity: none S/sx of ocular effects: none S/sx of eosinophilia/vasculitis: none  O:     Lab Results  Component Value Date   WBC 7.8 10/11/2021   HGB 12.7 10/11/2021   HCT 37.3 10/11/2021   MCV 92 10/11/2021   PLT 230 07/03/2011      Chemistry   No results found for: "NA", "K", "CL", "CO2", "BUN", "CREATININE", "GLU" No results found for: "CALCIUM", "ALKPHOS", "AST", "ALT", "BILITOT"     A/P: 1. Medication review: Patient currently on [place] for asthma/nasal polyposis. Reviewed the medication with the patient, including the following: Dupixent is a monoclonal antibody used for the treatment of asthma or atopic dermatitis. Patient educated on purpose, proper use and potential adverse effects of Dupixent. Possible adverse effects include increased risk of infection, ocular effects, vasculitis/eosinophilia, and hypersensitivity reactions. Administer as a SubQ injection and rotate sites. Allow the medication to reach room temp prior to administration (45 mins for 300 mg syringe or 30 min for 200 mg syringe). Do not shake. Discard any unused portion. No recommendations for any changes.   Benard Halsted, PharmD, [place] [place] [phone]

## 2022-10-20 ENCOUNTER — Other Ambulatory Visit (HOSPITAL_COMMUNITY): Payer: Self-pay

## 2022-10-20 ENCOUNTER — Encounter: Payer: Self-pay | Admitting: Internal Medicine

## 2022-10-22 ENCOUNTER — Other Ambulatory Visit (HOSPITAL_COMMUNITY): Payer: Self-pay

## 2022-10-22 ENCOUNTER — Ambulatory Visit: Payer: 59 | Admitting: Internal Medicine

## 2022-10-23 ENCOUNTER — Other Ambulatory Visit (HOSPITAL_COMMUNITY): Payer: Self-pay

## 2022-11-11 ENCOUNTER — Other Ambulatory Visit (HOSPITAL_COMMUNITY): Payer: Self-pay

## 2022-11-12 ENCOUNTER — Other Ambulatory Visit (HOSPITAL_COMMUNITY): Payer: Self-pay

## 2022-11-12 MED ORDER — SPIRONOLACTONE 50 MG PO TABS
50.0000 mg | ORAL_TABLET | Freq: Every day | ORAL | 2 refills | Status: DC
  Filled 2022-11-12: qty 90, 90d supply, fill #0

## 2022-11-18 ENCOUNTER — Other Ambulatory Visit (HOSPITAL_COMMUNITY): Payer: Self-pay

## 2022-11-26 ENCOUNTER — Other Ambulatory Visit (HOSPITAL_COMMUNITY): Payer: Self-pay

## 2022-11-26 ENCOUNTER — Other Ambulatory Visit: Payer: Self-pay

## 2022-12-04 ENCOUNTER — Other Ambulatory Visit: Payer: Self-pay

## 2022-12-04 ENCOUNTER — Other Ambulatory Visit (HOSPITAL_COMMUNITY): Payer: Self-pay

## 2022-12-10 ENCOUNTER — Other Ambulatory Visit (HOSPITAL_COMMUNITY): Payer: Self-pay

## 2022-12-10 MED ORDER — CLINDAMYCIN PHOS-BENZOYL PEROX 1.2-5 % EX GEL
Freq: Every morning | CUTANEOUS | 98 refills | Status: DC
  Filled 2022-12-10: qty 45, 30d supply, fill #0
  Filled 2023-01-05: qty 45, 30d supply, fill #1
  Filled 2023-02-17: qty 45, 30d supply, fill #2
  Filled 2023-04-03: qty 45, 30d supply, fill #3
  Filled 2023-05-27: qty 45, 30d supply, fill #4

## 2022-12-11 ENCOUNTER — Other Ambulatory Visit (HOSPITAL_COMMUNITY): Payer: Self-pay

## 2022-12-15 ENCOUNTER — Telehealth: Payer: Self-pay | Admitting: Internal Medicine

## 2022-12-15 MED ORDER — PREDNISONE 20 MG PO TABS
40.0000 mg | ORAL_TABLET | Freq: Every day | ORAL | 0 refills | Status: AC
  Filled 2022-12-15: qty 14, 7d supply, fill #0

## 2022-12-15 NOTE — Telephone Encounter (Signed)
12/15/2022  URI leading to bronchitis flare.  Rx prednisone to start.  Erskine Emery

## 2022-12-16 ENCOUNTER — Other Ambulatory Visit (HOSPITAL_COMMUNITY): Payer: Self-pay

## 2022-12-17 ENCOUNTER — Other Ambulatory Visit (HOSPITAL_COMMUNITY): Payer: Self-pay

## 2022-12-19 ENCOUNTER — Other Ambulatory Visit (HOSPITAL_COMMUNITY): Payer: Self-pay

## 2022-12-31 ENCOUNTER — Other Ambulatory Visit: Payer: Self-pay | Admitting: Allergy & Immunology

## 2022-12-31 ENCOUNTER — Other Ambulatory Visit (HOSPITAL_COMMUNITY): Payer: Self-pay

## 2022-12-31 MED ORDER — MONTELUKAST SODIUM 10 MG PO TABS
10.0000 mg | ORAL_TABLET | Freq: Every day | ORAL | 5 refills | Status: DC
  Filled 2022-12-31: qty 30, 30d supply, fill #0
  Filled 2023-01-05: qty 30, 30d supply, fill #1
  Filled 2023-02-11: qty 90, 90d supply, fill #1
  Filled 2023-05-07: qty 60, 60d supply, fill #2

## 2023-01-01 ENCOUNTER — Other Ambulatory Visit (HOSPITAL_COMMUNITY): Payer: Self-pay

## 2023-01-02 ENCOUNTER — Other Ambulatory Visit (HOSPITAL_COMMUNITY): Payer: Self-pay

## 2023-01-05 ENCOUNTER — Other Ambulatory Visit (HOSPITAL_COMMUNITY): Payer: Self-pay

## 2023-01-06 ENCOUNTER — Other Ambulatory Visit: Payer: Self-pay

## 2023-01-06 ENCOUNTER — Other Ambulatory Visit (HOSPITAL_COMMUNITY): Payer: Self-pay

## 2023-01-06 ENCOUNTER — Other Ambulatory Visit: Payer: Self-pay | Admitting: Family

## 2023-01-06 MED ORDER — FLUTICASONE-SALMETEROL 250-50 MCG/ACT IN AEPB
1.0000 | INHALATION_SPRAY | Freq: Two times a day (BID) | RESPIRATORY_TRACT | 5 refills | Status: DC
  Filled 2023-01-06: qty 60, 30d supply, fill #0

## 2023-01-07 ENCOUNTER — Other Ambulatory Visit (HOSPITAL_COMMUNITY): Payer: Self-pay

## 2023-01-13 ENCOUNTER — Other Ambulatory Visit (HOSPITAL_COMMUNITY): Payer: Self-pay

## 2023-01-14 ENCOUNTER — Ambulatory Visit (INDEPENDENT_AMBULATORY_CARE_PROVIDER_SITE_OTHER): Payer: Commercial Managed Care - PPO | Admitting: Allergy & Immunology

## 2023-01-14 ENCOUNTER — Other Ambulatory Visit (HOSPITAL_COMMUNITY): Payer: Self-pay

## 2023-01-14 ENCOUNTER — Other Ambulatory Visit: Payer: Self-pay

## 2023-01-14 ENCOUNTER — Encounter: Payer: Self-pay | Admitting: Allergy & Immunology

## 2023-01-14 ENCOUNTER — Ambulatory Visit: Payer: 59 | Admitting: Family

## 2023-01-14 VITALS — BP 116/78 | HR 78 | Temp 98.0°F | Resp 18 | Ht 66.0 in | Wt 189.2 lb

## 2023-01-14 DIAGNOSIS — J4540 Moderate persistent asthma, uncomplicated: Secondary | ICD-10-CM

## 2023-01-14 DIAGNOSIS — J302 Other seasonal allergic rhinitis: Secondary | ICD-10-CM

## 2023-01-14 DIAGNOSIS — J3089 Other allergic rhinitis: Secondary | ICD-10-CM

## 2023-01-14 DIAGNOSIS — J339 Nasal polyp, unspecified: Secondary | ICD-10-CM

## 2023-01-14 MED ORDER — FLUTICASONE-SALMETEROL 100-50 MCG/ACT IN AEPB
1.0000 | INHALATION_SPRAY | Freq: Two times a day (BID) | RESPIRATORY_TRACT | 5 refills | Status: DC
  Filled 2023-01-14 – 2023-02-11 (×2): qty 60, 30d supply, fill #0
  Filled 2023-03-06 – 2023-04-03 (×2): qty 60, 30d supply, fill #1
  Filled 2023-05-07: qty 60, 30d supply, fill #2
  Filled 2023-06-15: qty 60, 30d supply, fill #3

## 2023-01-14 NOTE — Progress Notes (Signed)
FOLLOW UP  Date of Service/Encounter:  01/14/23   Assessment:   Moderate persistent asthma, uncomplicated - with >44 rounds of prednisone in the last 18 months, markedly improved on Dupixent  Seasonal and perennial allergic rhinitis (mouse, [place] pig, weeds, grasses, outdoor molds and dust mites)   Nasal polyposis - s/p polypectomy    Plan/Recommendations:    1. Moderate persistent asthma, uncomplicated - Lung looked great. - Let's go down to the lower dose Advair.   - Daily controller medication(s): Singulair 10mg  daily and Advair 100/50 one puff twice daily.  - Prior to physical activity: albuterol 2 puffs 10-15 minutes before physical activity. - Rescue medications: albuterol 4 puffs every 4-6 hours as needed - Asthma control goals:  * Full participation in all desired activities (may need albuterol before activity) * Albuterol use two time or less a week on average (not counting use with activity) * Cough interfering with sleep two time or less a month * Oral steroids no more than once a year * No hospitalizations  2. Seasonal and perennial allergic rhinitis (mouse, [place] pig, weeds, grasses, outdoor molds and dust mites) - Continue levocetirizine 5 mg daily. - Continue with montelukast 10mg  daily. - Continue Xhance one spray per nostril twice daily   3. Nasal polyposis - Continue Dupixent as above - Continue Xhance one spray per nostril twice daily.  4. Follow up in 6 months or earlier if needed.    Subjective:   Sara Hamilton is a 37 y.o. 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  Sara Hamilton is a 37 y.o. female presenting for a follow up visit. She was last seen in November 2023. At that time, Sara Hamilton continued Singulair. She stopped the [place] and started Advair instead. She was continued on the Dupixent every 2 weeks. For her rhinitis, we continued with levocetirizine as well as montelukast and Xhance.   Since the last visit, she has done well.   Asthma/Respiratory Symptom History: Her Advair is very affordable. She using 2105mcg one puff BID. She did get sick three weeks ago and she had to have prednisone and seven days of antibiotics. She was at work and never got tested for [place]. This is her first round in several months, if not a year. She has been much better on the [place]. She also tested positive in October 2023 (mostly presented with a headache).  She has been doing very well on the current regimen. She is open to decreasing her Advair strength since she has previously been stable on that the lower strength in the past. Dupixent injections are going well. She has not had any reactions at all to the [place].   Allergic Rhinitis Symptom History: She does feel that her nasal congestion  is better. She had sinus surgery with Dr. Benjamine Hamilton and it helped temporarily. She thinks that was in 2022.  She has been on [place] (she has a stockpile of it). She is not sure whether her new insurance covers it, but she has had a stockpile of it at home and has not needed a refill.  She will let us know when she does end up getting a refill.  She has not had any sinus infections since starting the Dupixent.  She does feel like her nasal congestion is  improved.  She continues to work at [employer].  She works as a Statistician.  She describes it as chaos.  The hospital is always following the ER is always filled to capacity.  She seems to enjoy to work, however.  Otherwise, there have been no changes to her past medical history, surgical history, family history, or social history.    Review of Systems  Constitutional: Negative.  Negative for fever, malaise/fatigue and weight loss.  HENT: Negative.  Negative for congestion, ear discharge, ear pain and sinus pain.   Eyes:  Negative for pain, discharge and redness.  Respiratory:  Negative for cough, sputum production, shortness of breath and wheezing.   Cardiovascular: Negative.  Negative for chest pain and palpitations.  Gastrointestinal:  Negative for abdominal pain, heartburn, nausea and vomiting.  Skin: Negative.  Negative for itching and rash.  Neurological:  Negative for dizziness and headaches.  Endo/Heme/Allergies:  Positive for environmental allergies. Does not bruise/bleed easily.       Objective:   Blood pressure 116/78, pulse 78, temperature 98 F (36.7 C), resp. rate 18, height 5\' 6"  (1.676 m), weight 189 lb 3.2 oz (85.8 kg), SpO2 98 %. Body mass index is 30.54 kg/m.    Physical Exam Vitals reviewed.  Constitutional:      Appearance: She is well-developed.     Comments: Very pleasant.  HENT:     Head: Normocephalic and atraumatic.     Right Ear: Tympanic membrane, ear canal and external ear normal. No drainage, swelling or tenderness. Tympanic membrane is not injected, scarred, erythematous, retracted or bulging.     Left Ear: Tympanic membrane, ear canal and external ear normal. No drainage, swelling or tenderness. Tympanic membrane is not injected, scarred, erythematous, retracted or bulging.     Nose: No nasal deformity, septal deviation, mucosal edema or rhinorrhea.     Right Turbinates: Enlarged, swollen and pale.     Left Turbinates: Enlarged,  swollen and pale.     Right Sinus: No maxillary sinus tenderness or frontal sinus tenderness.     Left Sinus: No maxillary sinus tenderness or frontal sinus tenderness.     Comments: Turbinates are somewhat enlarged.  No polyps appreciated.    Mouth/Throat:     Mouth: Mucous membranes are not pale and not dry.     Pharynx: Uvula midline.  Eyes:     General:        Right eye: No discharge.        Left eye: No discharge.     Conjunctiva/sclera: Conjunctivae normal.     Right eye: Right conjunctiva is not injected. No chemosis.    Left eye: Left conjunctiva is not injected. No chemosis.    Pupils: Pupils are equal, round, and reactive to light.  Cardiovascular:     Rate and Rhythm: Normal rate and regular rhythm.     Heart sounds: Normal heart sounds.  Pulmonary:     Effort: Pulmonary effort  is normal. No tachypnea, accessory muscle usage or respiratory distress.     Breath sounds: Normal breath sounds. No wheezing, rhonchi or rales.     Comments: Moving air well in all lung fields. No increased work of breathing noted. No increased work of breathing noted.  Intermittent coughing during the visit. Chest:     Chest wall: No tenderness.  Abdominal:     Tenderness: There is no abdominal tenderness. There is no guarding or rebound.  Lymphadenopathy:     Head:     Right side of head: No submandibular, tonsillar or occipital adenopathy.     Left side of head: No submandibular, tonsillar or occipital adenopathy.     Cervical: No cervical adenopathy.  Skin:    General: Skin is warm.     Capillary Refill: Capillary refill takes less than 2 seconds.     Coloration: Skin is not pale.     Findings: No abrasion, erythema, petechiae or rash. Rash is not papular, urticarial or vesicular.  Neurological:     Mental Status: She is alert.  Psychiatric:        Behavior: Behavior is cooperative.      Diagnostic studies:    Spirometry: results normal (FEV1: 2.92/89%, FVC: 3.47/87%, FEV1/FVC:  84%).    Spirometry consistent with normal pattern.   Allergy Studies: none        Salvatore Marvel, MD  [facility]

## 2023-01-14 NOTE — Patient Instructions (Addendum)
1. Moderate persistent asthma, uncomplicated - Lung looked great. - Let's go down to the lower dose Advair.   - Daily controller medication(s): Singulair 10mg  daily and Advair 100/50 one puff twice daily.  - Prior to physical activity: albuterol 2 puffs 10-15 minutes before physical activity. - Rescue medications: albuterol 4 puffs every 4-6 hours as needed - Asthma control goals:  * Full participation in all desired activities (may need albuterol before activity) * Albuterol use two time or less a week on average (not counting use with activity) * Cough interfering with sleep two time or less a month * Oral steroids no more than once a year * No hospitalizations  2. Seasonal and perennial allergic rhinitis (mouse, [place] pig, weeds, grasses, outdoor molds and dust mites) - Continue levocetirizine 5 mg daily. - Continue with montelukast 10mg  daily. - Continue Xhance one spray per nostril twice daily   3. Nasal polyposis - Continue Dupixent as above - Continue Xhance one spray per nostril twice daily.  4. Follow up in 6 months or earlier if needed.

## 2023-01-19 ENCOUNTER — Other Ambulatory Visit: Payer: Self-pay

## 2023-01-23 ENCOUNTER — Other Ambulatory Visit (HOSPITAL_COMMUNITY): Payer: Self-pay

## 2023-01-27 ENCOUNTER — Other Ambulatory Visit (HOSPITAL_COMMUNITY): Payer: Self-pay

## 2023-02-11 ENCOUNTER — Other Ambulatory Visit (HOSPITAL_COMMUNITY): Payer: Self-pay

## 2023-02-17 ENCOUNTER — Other Ambulatory Visit (HOSPITAL_COMMUNITY): Payer: Self-pay

## 2023-02-17 ENCOUNTER — Other Ambulatory Visit: Payer: Self-pay

## 2023-02-17 MED ORDER — SPIRONOLACTONE 50 MG PO TABS
50.0000 mg | ORAL_TABLET | Freq: Every day | ORAL | 3 refills | Status: DC
  Filled 2023-02-17: qty 30, 30d supply, fill #0
  Filled 2023-03-17: qty 90, 90d supply, fill #1

## 2023-02-18 ENCOUNTER — Ambulatory Visit: Payer: Self-pay

## 2023-02-18 ENCOUNTER — Other Ambulatory Visit: Payer: Self-pay | Admitting: Family Medicine

## 2023-02-18 DIAGNOSIS — S66911A Strain of unspecified muscle, fascia and tendon at wrist and hand level, right hand, initial encounter: Secondary | ICD-10-CM

## 2023-03-06 ENCOUNTER — Other Ambulatory Visit (HOSPITAL_COMMUNITY): Payer: Self-pay

## 2023-03-17 ENCOUNTER — Other Ambulatory Visit (HOSPITAL_COMMUNITY): Payer: Self-pay

## 2023-03-18 ENCOUNTER — Other Ambulatory Visit (HOSPITAL_COMMUNITY): Payer: Self-pay

## 2023-03-20 ENCOUNTER — Other Ambulatory Visit (HOSPITAL_COMMUNITY): Payer: Self-pay

## 2023-04-01 ENCOUNTER — Other Ambulatory Visit (HOSPITAL_COMMUNITY): Payer: Self-pay

## 2023-04-03 ENCOUNTER — Other Ambulatory Visit: Payer: Self-pay

## 2023-04-03 ENCOUNTER — Other Ambulatory Visit: Payer: Self-pay | Admitting: Allergy & Immunology

## 2023-04-03 ENCOUNTER — Other Ambulatory Visit (HOSPITAL_COMMUNITY): Payer: Self-pay

## 2023-04-03 MED ORDER — LEVOCETIRIZINE DIHYDROCHLORIDE 5 MG PO TABS
5.0000 mg | ORAL_TABLET | Freq: Every evening | ORAL | 5 refills | Status: DC
  Filled 2023-04-03: qty 90, 90d supply, fill #0
  Filled 2023-06-29: qty 90, 90d supply, fill #1

## 2023-04-14 ENCOUNTER — Other Ambulatory Visit: Payer: Self-pay

## 2023-04-20 ENCOUNTER — Other Ambulatory Visit: Payer: Self-pay

## 2023-05-07 ENCOUNTER — Other Ambulatory Visit (HOSPITAL_COMMUNITY): Payer: Self-pay

## 2023-05-08 ENCOUNTER — Other Ambulatory Visit (HOSPITAL_COMMUNITY): Payer: Self-pay

## 2023-05-15 ENCOUNTER — Other Ambulatory Visit (HOSPITAL_COMMUNITY): Payer: Self-pay

## 2023-05-27 ENCOUNTER — Other Ambulatory Visit (HOSPITAL_COMMUNITY): Payer: Self-pay

## 2023-06-10 ENCOUNTER — Other Ambulatory Visit (HOSPITAL_COMMUNITY): Payer: Self-pay

## 2023-06-12 ENCOUNTER — Other Ambulatory Visit (HOSPITAL_COMMUNITY): Payer: Self-pay

## 2023-06-15 ENCOUNTER — Other Ambulatory Visit: Payer: Self-pay

## 2023-06-15 ENCOUNTER — Other Ambulatory Visit (HOSPITAL_COMMUNITY): Payer: Self-pay

## 2023-06-15 MED ORDER — SPIRONOLACTONE 50 MG PO TABS
50.0000 mg | ORAL_TABLET | Freq: Every day | ORAL | 1 refills | Status: DC
  Filled 2023-06-15: qty 30, 30d supply, fill #0
  Filled 2023-07-15: qty 30, 30d supply, fill #1

## 2023-06-17 ENCOUNTER — Other Ambulatory Visit (HOSPITAL_COMMUNITY): Payer: Self-pay

## 2023-06-28 ENCOUNTER — Other Ambulatory Visit: Payer: Self-pay | Admitting: Allergy & Immunology

## 2023-06-29 ENCOUNTER — Other Ambulatory Visit (HOSPITAL_COMMUNITY): Payer: Self-pay

## 2023-06-30 ENCOUNTER — Other Ambulatory Visit: Payer: Self-pay

## 2023-07-07 ENCOUNTER — Other Ambulatory Visit (HOSPITAL_COMMUNITY): Payer: Self-pay

## 2023-07-13 ENCOUNTER — Other Ambulatory Visit: Payer: Self-pay

## 2023-07-14 ENCOUNTER — Other Ambulatory Visit (HOSPITAL_COMMUNITY): Payer: Self-pay

## 2023-07-14 ENCOUNTER — Other Ambulatory Visit: Payer: Self-pay | Admitting: Allergy & Immunology

## 2023-07-15 ENCOUNTER — Ambulatory Visit: Payer: Commercial Managed Care - PPO | Admitting: Family Medicine

## 2023-07-15 ENCOUNTER — Encounter: Payer: Self-pay | Admitting: Family Medicine

## 2023-07-15 ENCOUNTER — Encounter: Payer: Self-pay | Admitting: Allergy & Immunology

## 2023-07-15 ENCOUNTER — Other Ambulatory Visit (HOSPITAL_COMMUNITY): Payer: Self-pay

## 2023-07-15 ENCOUNTER — Other Ambulatory Visit: Payer: Self-pay

## 2023-07-15 ENCOUNTER — Other Ambulatory Visit: Payer: Self-pay | Admitting: Allergy & Immunology

## 2023-07-15 VITALS — BP 124/72 | HR 69 | Temp 98.2°F | Resp 18 | Ht 67.0 in | Wt 185.2 lb

## 2023-07-15 DIAGNOSIS — J4540 Moderate persistent asthma, uncomplicated: Secondary | ICD-10-CM

## 2023-07-15 DIAGNOSIS — J302 Other seasonal allergic rhinitis: Secondary | ICD-10-CM | POA: Diagnosis not present

## 2023-07-15 DIAGNOSIS — J339 Nasal polyp, unspecified: Secondary | ICD-10-CM | POA: Diagnosis not present

## 2023-07-15 DIAGNOSIS — J3089 Other allergic rhinitis: Secondary | ICD-10-CM

## 2023-07-15 MED ORDER — ALBUTEROL SULFATE HFA 108 (90 BASE) MCG/ACT IN AERS
2.0000 | INHALATION_SPRAY | Freq: Four times a day (QID) | RESPIRATORY_TRACT | 1 refills | Status: DC | PRN
  Filled 2023-07-15: qty 6.7, 25d supply, fill #0
  Filled 2024-06-15: qty 6.7, 25d supply, fill #1

## 2023-07-15 MED ORDER — FLUTICASONE-SALMETEROL 100-50 MCG/ACT IN AEPB
1.0000 | INHALATION_SPRAY | Freq: Two times a day (BID) | RESPIRATORY_TRACT | 5 refills | Status: DC
  Filled 2023-07-15: qty 60, 30d supply, fill #0
  Filled 2023-09-02: qty 60, 30d supply, fill #1
  Filled 2023-10-02: qty 60, 30d supply, fill #2
  Filled 2023-12-13: qty 60, 30d supply, fill #3

## 2023-07-15 MED ORDER — LORATADINE 10 MG PO TABS
10.0000 mg | ORAL_TABLET | Freq: Every day | ORAL | 1 refills | Status: DC | PRN
  Filled 2023-07-15: qty 100, 50d supply, fill #0
  Filled 2023-07-15: qty 180, 90d supply, fill #0

## 2023-07-15 MED ORDER — MONTELUKAST SODIUM 10 MG PO TABS
10.0000 mg | ORAL_TABLET | Freq: Every day | ORAL | 1 refills | Status: DC
  Filled 2023-07-15: qty 90, 90d supply, fill #0
  Filled 2023-10-12: qty 90, 90d supply, fill #1

## 2023-07-15 NOTE — Progress Notes (Signed)
[address] [place] [address] Dept: [phone]  FOLLOW UP NOTE  Patient ID: Sara Hamilton, female    DOB: June 27, 1986  Age: 37 y.o. MRN: [id number] Date of Office Visit: 07/15/2023  Assessment  Chief Complaint: Follow-up  HPI Sara Hamilton is a 37 year old female who presents to the clinic for follow-up visit.  She was last seen in this clinic on 01/14/2023 by Dr. [name] for evaluation of asthma on Dupixent injections, allergic rhinitis, and nasal polyposis.  At today's visit, she reports that her asthma has been well controlled with no shortness of breath, cough or wheeze with activity or rest. She continues montelukast 10 mg once a day and uses Advair 100-1 puff twice a day and rarely needs to use albuterol. She continues Dupixent injections 300 mg once every 14 days with no large or local reactions.  She reports a significant decrease in her symptoms of asthma while continuing on Dupixent injections.  Allergic rhinitis is reported as moderately well-controlled with infrequent symptoms including rhinorrhea and nasal congestion.  She continues levocetirizine and Xhance daily.  Her last environmental allergy skin testing was on 04/28/2018 was positive to mouse, [place] pig, grass pollen, weed pollen, outdoor mold, and dust mite.  Nasal polyposis is reported as well-controlled with no loss of smell or taste.  She continues Dupixent injections as well as Xhance nasal spray.  Her current medications are listed in the chart.  Drug Allergies:  No Known Allergies  Physical Exam: BP 124/72   Pulse 69   Temp 98.2 F (36.8 C)   Resp 18   Ht 5\' 7"  (1.702 m)   Wt 185 lb 3.2 oz (84 kg)   SpO2 97%   BMI 29.01 kg/m    Physical Exam Vitals reviewed.  Constitutional:      Appearance: Normal appearance.  HENT:     Head: Normocephalic and atraumatic.     Right Ear: Tympanic membrane normal.     Left Ear: Tympanic membrane normal.     Nose:     Comments: Bilateral  nares slightly erythematous with thin clear nasal drainage noted.  Pharynx normal.  Ears normal.  Eyes normal.    Mouth/Throat:     Pharynx: Oropharynx is clear.  Eyes:     Conjunctiva/sclera: Conjunctivae normal.  Cardiovascular:     Rate and Rhythm: Normal rate and regular rhythm.     Heart sounds: Normal heart sounds. No murmur heard. Pulmonary:     Effort: Pulmonary effort is normal.     Breath sounds: Normal breath sounds.     Comments: Lungs clear to auscultation Musculoskeletal:        General: Normal range of motion.  Skin:    General: Skin is warm and dry.  Neurological:     Mental Status: She is alert and oriented to person, place, and time.  Psychiatric:        Mood and Affect: Mood normal.        Behavior: Behavior normal.        Thought Content: Thought content normal.        Judgment: Judgment normal.     Assessment and Plan: 1. Moderate persistent asthma, uncomplicated   2. Seasonal and perennial allergic rhinitis   3. Nasal polyposis     Meds ordered this encounter  Medications   montelukast (SINGULAIR) 10 MG tablet    Sig: Take 1 tablet (10 mg total) by mouth at bedtime. No future refill needs appointment.    Dispense:  90 tablet    Refill:  1   fluticasone-salmeterol (ADVAIR) 100-50 MCG/ACT AEPB    Sig: Inhale 1 puff into the lungs 2 (two) times daily.    Dispense:  60 each    Refill:  5   albuterol (VENTOLIN HFA) 108 (90 Base) MCG/ACT inhaler    Sig: Inhale 2 puffs into the lungs every 6 (six) hours as needed for wheezing or shortness of breath.    Dispense:  18 g    Refill:  1   DISCONTD: loratadine (CLARITIN) 10 MG tablet    Sig: Take 1 tablet (10 mg total) by mouth daily as needed for allergies (Can take an extra dose during flare ups.).    Dispense:  180 tablet    Refill:  1    Patient Instructions  Asthma Continue montelukast 10 mg once a day to prevent cough or wheeze Continue Advair 100-1 puff twice a day to control asthma  symptoms Continue albuterol 2 puffs once every 4 hours as needed for cough or wheeze You may use albuterol 2 puffs 5 to 15 minutes before activity to decrease cough or wheeze Continue Dupixent injections 300 mg once every 14 days for control of asthma symptoms  Allergic rhinitis Continue allergen avoidance measures directed toward weed pollen, grass pollen, outdoor mold, dust mite, and pets as listed below Continue levocetirizine 5 mg once a day as needed for runny nose or itch Continue Xhance 2 sprays in each nostril once or twice a day as needed for a stuffy nose Consider saline nasal rinses as needed for nasal symptoms. Use this before any medicated nasal sprays for best result  Nasal polyposis Continue Xhance 2 sprays in each nostril once or twice a day to control nasal polyps  Call the clinic if this treatment plan is not working well for you.  Follow up in 6 months or sooner if needed.  Return in about 6 months (around 01/15/2024), or if symptoms worsen or fail to improve.    Thank you for the opportunity to care for this patient.  Please do not hesitate to contact me with questions.  Thermon Leyland, FNP [facility]

## 2023-07-15 NOTE — Patient Instructions (Addendum)
Asthma Continue montelukast 10 mg once a day to prevent cough or wheeze Continue Advair 100-1 puff twice a day to control asthma symptoms Continue albuterol 2 puffs once every 4 hours as needed for cough or wheeze You may use albuterol 2 puffs 5 to 15 minutes before activity to decrease cough or wheeze Continue Dupixent injections 300 mg once every 14 days for control of asthma symptoms  Allergic rhinitis Continue allergen avoidance measures directed toward weed pollen, grass pollen, outdoor mold, dust mite, and pets as listed below Continue levocetirizine 5 mg once a day as needed for runny nose or itch Continue Xhance 2 sprays in each nostril once or twice a day as needed for a stuffy nose Consider saline nasal rinses as needed for nasal symptoms. Use this before any medicated nasal sprays for best result  Nasal polyposis Continue Xhance 2 sprays in each nostril once or twice a day to control nasal polyps  Call the clinic if this treatment plan is not working well for you.  Follow up in 6 months or sooner if needed.

## 2023-07-16 ENCOUNTER — Other Ambulatory Visit (HOSPITAL_COMMUNITY): Payer: Self-pay

## 2023-07-17 ENCOUNTER — Other Ambulatory Visit: Payer: Self-pay | Admitting: Allergy & Immunology

## 2023-07-17 ENCOUNTER — Encounter: Payer: Self-pay | Admitting: Family Medicine

## 2023-07-17 ENCOUNTER — Other Ambulatory Visit (HOSPITAL_COMMUNITY): Payer: Self-pay

## 2023-07-17 DIAGNOSIS — Z131 Encounter for screening for diabetes mellitus: Secondary | ICD-10-CM | POA: Diagnosis not present

## 2023-07-17 DIAGNOSIS — E785 Hyperlipidemia, unspecified: Secondary | ICD-10-CM | POA: Diagnosis not present

## 2023-07-17 MED ORDER — LEVOCETIRIZINE DIHYDROCHLORIDE 5 MG PO TABS
5.0000 mg | ORAL_TABLET | Freq: Every evening | ORAL | 5 refills | Status: DC
  Filled 2023-07-17: qty 30, 30d supply, fill #0
  Filled 2023-10-02: qty 90, 90d supply, fill #0
  Filled 2023-12-28: qty 90, 90d supply, fill #1

## 2023-08-04 ENCOUNTER — Other Ambulatory Visit (HOSPITAL_COMMUNITY): Payer: Self-pay

## 2023-08-06 ENCOUNTER — Other Ambulatory Visit (HOSPITAL_COMMUNITY): Payer: Self-pay

## 2023-08-10 ENCOUNTER — Other Ambulatory Visit (HOSPITAL_COMMUNITY): Payer: Self-pay

## 2023-08-31 ENCOUNTER — Other Ambulatory Visit (HOSPITAL_COMMUNITY): Payer: Self-pay

## 2023-09-02 ENCOUNTER — Other Ambulatory Visit (HOSPITAL_COMMUNITY): Payer: Self-pay

## 2023-09-05 ENCOUNTER — Encounter (HOSPITAL_COMMUNITY): Payer: Self-pay

## 2023-09-28 ENCOUNTER — Telehealth: Payer: Self-pay | Admitting: Allergy & Immunology

## 2023-09-28 MED ORDER — XHANCE 93 MCG/ACT NA EXHU: 2.0000 | INHALANT_SUSPENSION | Freq: Two times a day (BID) | NASAL | 0 refills | Status: DC | PRN

## 2023-09-28 NOTE — Telephone Encounter (Signed)
Patient called stating she needs a refill of Xhance sent to [facility]. Patient states she tried to refill it through the website and could not refill it from there.

## 2023-09-28 NOTE — Telephone Encounter (Signed)
Called and spoke to patient and notified her of the refill approval, and she verbalized understanding. We also confirmed her upcoming appointment with Dr. [name].

## 2023-09-29 ENCOUNTER — Ambulatory Visit: Payer: Commercial Managed Care - PPO | Attending: Internal Medicine | Admitting: Pharmacist

## 2023-09-29 ENCOUNTER — Encounter (HOSPITAL_COMMUNITY): Payer: Self-pay

## 2023-09-29 ENCOUNTER — Other Ambulatory Visit (HOSPITAL_COMMUNITY): Payer: Self-pay

## 2023-09-29 ENCOUNTER — Other Ambulatory Visit: Payer: Self-pay

## 2023-09-29 ENCOUNTER — Other Ambulatory Visit: Payer: Self-pay | Admitting: Allergy & Immunology

## 2023-09-29 DIAGNOSIS — Z79899 Other long term (current) drug therapy: Secondary | ICD-10-CM

## 2023-09-29 MED ORDER — DUPIXENT 300 MG/2ML ~~LOC~~ SOSY
300.0000 mg | PREFILLED_SYRINGE | SUBCUTANEOUS | 11 refills | Status: DC
  Filled 2023-09-30: qty 4, 28d supply, fill #0
  Filled 2023-10-30: qty 4, 28d supply, fill #1
  Filled 2023-11-27: qty 4, 28d supply, fill #2
  Filled 2023-12-29: qty 4, 28d supply, fill #3
  Filled 2024-01-14: qty 4, 28d supply, fill #4
  Filled 2024-02-22 (×2): qty 4, 28d supply, fill #5
  Filled 2024-03-24: qty 4, 28d supply, fill #6
  Filled 2024-04-21: qty 4, 28d supply, fill #7
  Filled 2024-05-24: qty 4, 28d supply, fill #8
  Filled 2024-06-20: qty 4, 28d supply, fill #9
  Filled 2024-07-12: qty 4, 28d supply, fill #10
  Filled 2024-08-02: qty 4, 28d supply, fill #11

## 2023-09-29 MED ORDER — DUPIXENT 300 MG/2ML ~~LOC~~ SOSY
300.0000 mg | PREFILLED_SYRINGE | SUBCUTANEOUS | 11 refills | Status: DC
  Filled 2023-09-29: qty 4, 28d supply, fill #0

## 2023-09-29 NOTE — Progress Notes (Signed)
S: Patient presents for review of their specialty medication therapy.  Patient is currently taking Dupixent for asthma/nasal polyposis. Patient is managed by Dr. [name] for this.   Adherence: confirms.   Efficacy: tells me today that the medication is amazing! She is experiencing great efficacy with the Dupixent.  Dosing: 600 mg subq x1 followed by 300 mg q14days  Dose adjustments: Renal: no dose adjustments (has not been studied) Hepatic: no dose adjustments (has not been studied)  Drug-drug interactions: none identified   Screening: TB test: completed   Monitoring: S/sx of infection: none S/sx of hypersensitivity: none S/sx of ocular effects: none S/sx of eosinophilia/vasculitis: none  O:     Lab Results  Component Value Date   WBC 7.8 10/11/2021   HGB 12.7 10/11/2021   HCT 37.3 10/11/2021   MCV 92 10/11/2021   PLT 230 07/03/2011      Chemistry   No results found for: "NA", "K", "CL", "CO2", "BUN", "CREATININE", "GLU" No results found for: "CALCIUM", "ALKPHOS", "AST", "ALT", "BILITOT"     A/P: 1. Medication review: Patient currently on Dupixent for asthma/nasal polyposis. Reviewed the medication with the patient, including the following: Dupixent is a monoclonal antibody used for the treatment of asthma or atopic dermatitis. Patient educated on purpose, proper use and potential adverse effects of Dupixent. Possible adverse effects include increased risk of infection, ocular effects, vasculitis/eosinophilia, and hypersensitivity reactions. Administer as a SubQ injection and rotate sites. Allow the medication to reach room temp prior to administration (45 mins for 300 mg syringe or 30 min for 200 mg syringe). Do not shake. Discard any unused portion. No recommendations for any changes.   Butch Penny, PharmD, [place]y [place], CPP Clinical Pharmacist [facility] [phone]

## 2023-09-29 NOTE — Progress Notes (Signed)
Specialty Pharmacy Ongoing Clinical Assessment Note  Sara Hamilton is a 37 y.o. female who is being followed by the specialty pharmacy service for RxSp Asthma/COPD   Patient's specialty medication(s) reviewed today: Dupilumab   Missed doses in the last 4 weeks: 0   Patient/Caregiver did not have any additional questions or concerns.   Therapeutic benefit summary: Patient is achieving benefit   Adverse events/side effects summary: No adverse events/side effects   Patient's therapy is appropriate to: Continue    Goals Addressed             This Visit's Progress    Minimize recurrence of flares       Patient is on track. Patient will maintain adherence. [name] reports she is well-controlled at this time with no recent flares.         Follow up:  6 months  Servando Snare Specialty Pharmacist

## 2023-09-29 NOTE — Progress Notes (Signed)
Specialty Pharmacy Refill Coordination Note  Sara Hamilton is a 37 y.o. female contacted today regarding refills of specialty medication(s) Dupilumab   Patient requested Delivery   Delivery date: 10/08/23   Verified address: [address]  [place] [address]   Medication will be filled on 10/07/23 *Pending refill request*.

## 2023-09-30 ENCOUNTER — Other Ambulatory Visit: Payer: Self-pay

## 2023-10-02 ENCOUNTER — Other Ambulatory Visit (HOSPITAL_COMMUNITY): Payer: Self-pay

## 2023-10-12 ENCOUNTER — Other Ambulatory Visit (HOSPITAL_COMMUNITY): Payer: Self-pay

## 2023-10-30 ENCOUNTER — Other Ambulatory Visit: Payer: Self-pay

## 2023-10-30 NOTE — Progress Notes (Signed)
Specialty Pharmacy Refill Coordination Note  Sara Hamilton is a 37 y.o. female contacted today regarding refills of specialty medication(s) Dupilumab   Patient requested Delivery   Delivery date: 11/03/23   Verified address: [address] [place] [address]   Medication will be filled on 11/02/23.

## 2023-11-02 ENCOUNTER — Other Ambulatory Visit (HOSPITAL_COMMUNITY): Payer: Self-pay

## 2023-11-03 ENCOUNTER — Encounter: Payer: Self-pay | Admitting: Allergy & Immunology

## 2023-11-18 ENCOUNTER — Other Ambulatory Visit (HOSPITAL_COMMUNITY): Payer: Self-pay

## 2023-11-27 ENCOUNTER — Other Ambulatory Visit: Payer: Self-pay

## 2023-11-27 NOTE — Progress Notes (Signed)
Specialty Pharmacy Refill Coordination Note  Sara Hamilton is a 37 y.o. female contacted today regarding refills of specialty medication(s) Dupilumab (Dupixent)   Patient requested Delivery   Delivery date: 12/04/23   Verified address: [address] [place] [address]   Medication will be filled on 12.19.24.

## 2023-12-03 ENCOUNTER — Other Ambulatory Visit: Payer: Self-pay

## 2023-12-04 ENCOUNTER — Other Ambulatory Visit: Payer: Self-pay

## 2023-12-13 ENCOUNTER — Other Ambulatory Visit: Payer: Self-pay | Admitting: Allergy & Immunology

## 2023-12-14 ENCOUNTER — Other Ambulatory Visit: Payer: Self-pay

## 2023-12-14 ENCOUNTER — Other Ambulatory Visit (HOSPITAL_COMMUNITY): Payer: Self-pay

## 2023-12-14 MED ORDER — XHANCE 93 MCG/ACT NA EXHU
2.0000 | INHALANT_SUSPENSION | Freq: Two times a day (BID) | NASAL | 0 refills | Status: AC | PRN
Start: 2023-12-14 — End: ?
  Filled 2023-12-14: qty 48, 90d supply, fill #0

## 2023-12-15 ENCOUNTER — Other Ambulatory Visit: Payer: Self-pay

## 2023-12-28 ENCOUNTER — Other Ambulatory Visit (HOSPITAL_COMMUNITY): Payer: Self-pay

## 2023-12-28 ENCOUNTER — Telehealth: Payer: Self-pay

## 2023-12-28 NOTE — Telephone Encounter (Signed)
*  Asthma/Allergy  Pharmacy Patient Advocate Encounter   Received notification from CoverMyMeds that prior authorization for Xhance  93MCG/ACT exhaler suspension  is required/requested.   Insurance verification completed.   The patient is insured through [facility] .   Per test claim: PA required; PA submitted to above mentioned insurance via CoverMyMeds Key/confirmation #/EOC [id number] Status is pending

## 2023-12-29 ENCOUNTER — Other Ambulatory Visit: Payer: Self-pay

## 2023-12-29 ENCOUNTER — Other Ambulatory Visit (HOSPITAL_COMMUNITY): Payer: Self-pay

## 2023-12-29 NOTE — Progress Notes (Signed)
 Specialty Pharmacy Refill Coordination Note  Sara Hamilton is a 38 y.o. female contacted today regarding refills of specialty medication(s) Dupilumab  (Dupixent )   Patient requested Delivery   Delivery date: 12/31/23   Verified address: [address]   Medication will be filled on 12/30/23.

## 2023-12-30 ENCOUNTER — Other Ambulatory Visit (HOSPITAL_COMMUNITY): Payer: Self-pay

## 2023-12-30 ENCOUNTER — Other Ambulatory Visit: Payer: Self-pay

## 2023-12-30 ENCOUNTER — Ambulatory Visit: Payer: Commercial Managed Care - PPO | Admitting: Allergy & Immunology

## 2023-12-30 ENCOUNTER — Other Ambulatory Visit: Payer: Self-pay | Admitting: Allergy & Immunology

## 2023-12-30 ENCOUNTER — Encounter: Payer: Self-pay | Admitting: Allergy & Immunology

## 2023-12-30 VITALS — BP 122/82 | HR 80 | Temp 97.8°F | Resp 14 | Wt 189.2 lb

## 2023-12-30 DIAGNOSIS — J4540 Moderate persistent asthma, uncomplicated: Secondary | ICD-10-CM

## 2023-12-30 DIAGNOSIS — J302 Other seasonal allergic rhinitis: Secondary | ICD-10-CM

## 2023-12-30 DIAGNOSIS — J3089 Other allergic rhinitis: Secondary | ICD-10-CM

## 2023-12-30 DIAGNOSIS — J339 Nasal polyp, unspecified: Secondary | ICD-10-CM

## 2023-12-30 MED ORDER — MONTELUKAST SODIUM 10 MG PO TABS
10.0000 mg | ORAL_TABLET | Freq: Every day | ORAL | 1 refills | Status: DC
  Filled 2023-12-30 – 2024-01-13 (×2): qty 90, 90d supply, fill #0
  Filled 2024-05-04: qty 90, 90d supply, fill #1

## 2023-12-30 MED ORDER — FLUTICASONE-SALMETEROL 100-50 MCG/ACT IN AEPB
1.0000 | INHALATION_SPRAY | Freq: Two times a day (BID) | RESPIRATORY_TRACT | 5 refills | Status: DC
  Filled 2023-12-30 – 2024-01-13 (×2): qty 60, 30d supply, fill #0
  Filled 2024-03-07: qty 60, 30d supply, fill #1
  Filled 2024-04-05: qty 60, 30d supply, fill #2
  Filled 2024-05-04: qty 60, 30d supply, fill #3
  Filled 2024-06-15: qty 60, 30d supply, fill #4

## 2023-12-30 MED ORDER — LEVOCETIRIZINE DIHYDROCHLORIDE 5 MG PO TABS
5.0000 mg | ORAL_TABLET | Freq: Every evening | ORAL | 5 refills | Status: DC
  Filled 2023-12-30 – 2024-04-05 (×3): qty 30, 30d supply, fill #0
  Filled 2024-05-04: qty 30, 30d supply, fill #1
  Filled 2024-06-15: qty 30, 30d supply, fill #2
  Filled 2024-06-15 (×2): qty 90, 90d supply, fill #2

## 2023-12-30 MED ORDER — XHANCE 93 MCG/ACT NA EXHU: 2.0000 | INHALANT_SUSPENSION | Freq: Two times a day (BID) | NASAL | 0 refills | Status: DC | PRN

## 2023-12-30 NOTE — Telephone Encounter (Signed)
 Pharmacy Patient Advocate Encounter  Received notification from [employer] that Prior Authorization for Xhance   has been APPROVED from 12/29/2023 to 12/27/2024. Ran test claim, Copay is $24.99. This test claim was processed through [facility]- copay amounts may vary at other pharmacies due to pharmacy/plan contracts, or as the patient moves through the different stages of their insurance plan.

## 2023-12-30 NOTE — Progress Notes (Signed)
 FOLLOW UP  Date of Service/Encounter:  12/30/23   Assessment:   Moderate persistent asthma, uncomplicated - with >10 rounds of prednisone  in the last 18 months, markedly improved on Dupixent    Seasonal and perennial allergic rhinitis (mouse, Israel pig, weeds, grasses, outdoor molds and dust mites)   Nasal polyposis - s/p polypectomy  Plan/Recommendations:   1. Moderate persistent asthma, uncomplicated - Lung looked great today.  - Daily controller medication(s): Singulair  10mg  daily and Advair  100/50 one puff once daily.  - Prior to physical activity: albuterol  2 puffs 10-15 minutes before physical activity. - Changes during respiratory infections or worsening symptoms: Increase Advair  100/5 to 1 puff twice daily for TWO WEEKS. - Asthma control goals:  * Full participation in all desired activities (may need albuterol  before activity) * Albuterol  use two time or less a week on average (not counting use with activity) * Cough interfering with sleep two time or less a month * Oral steroids no more than once a year * No hospitalizations  2. Seasonal and perennial allergic rhinitis (mouse, Israel pig, weeds, grasses, outdoor molds and dust mites) - Continue levocetirizine 5 mg daily. - Continue with montelukast  10mg  daily. - We are going to send in Xhance  to [facility], so let me know how much it is if you get a chance.  - Consider allergy shots for long term control.   3. Nasal polyposis - Continue Dupixent  as above - Continue Xhance  one spray per nostril twice daily.  4. Return in about 6 months (around 06/28/2024). You can have the follow up appointment with Dr. Idolina Maker or a Nurse Practicioner (our Nurse Practitioners are excellent and always have Physician oversight!).   Subjective:   Sara Hamilton is a 38 y.o. female presenting today for follow up of  Chief Complaint  Patient presents with   Follow-up    Sara Hamilton has a history of the following: Patient  Active Problem List   Diagnosis Date Noted   Nasal polyposis 10/02/2021   Encounter for gynecological examination with Papanicolaou smear of cervix 08/07/2021   Cough variant asthma with ? UACS /cyclical cough component  07/04/2019   Upper airway cough syndrome 05/01/2018   Seasonal and perennial allergic rhinitis 04/28/2018   Moderate persistent asthma, uncomplicated 04/28/2018   Contraceptive management 11/07/2013   Supervision of normal pregnancy 07/02/2011    History obtained from: chart review and patient.  Discussed the use of AI scribe software for clinical note transcription with the patient and/or guardian, who gave verbal consent to proceed.  Sara Hamilton is a 37 y.o. female presenting for a follow up visit.  She was last seen in July 2024 by Rexene Catching one of our esteemed nurse practitioners.  At that time, she was continued on Advair  100 mcg 1 puff twice daily as well as montelukast  and albuterol  as needed.  She also remained on Dupixent  every 14 days for management of her asthma and nasal polyps.  For her allergic rhinitis, she continued on levocetirizine as well as Xhance .  Since last visit, he has done very well.  Asthma/Respiratory Symptom History: She remains on Advair , but admits that she usually just is 1 puff at night.  She forgets the morning 1 pretty frequently.  She has not been on prednisone .  She has not been to the emergency room.  She does not have the last time she used her albuterol .  Overall, symptoms are under good control.  She remains on her Dupixent  every 14 days for her nasal  polyps and asthma.  She administers this herself.  Allergic Rhinitis Symptom History: She has not received her Xhance  prescription in a while.  Coverage was lackluster last year, so she has Flonase  to tide her over until she gets Xhance  prescription.  She has not been on antibiotics.  She continues on her antihistamine.  She reports the postnasal drip continues to be a problem, but it does not really  bother her all that much.  She has coworkers that mention her throat clearing and postnasal drip more than it bothers her.  She has never been on allergy shots.  She has not had any sinus or ear infections.]  She continues to work at [employer].  She works as a Buyer, retail.  She describes it as chaos.  She seems to enjoy to work, however. She works 3 12 hours shifts. They did have fun un the snow and she went sledding once. Her kiddos had a blast, though!   Otherwise, there have been no changes to her past medical history, surgical history, family history, or social history.    Review of systems otherwise negative other than that mentioned in the HPI.    Objective:   Blood pressure 122/82, pulse 80, temperature 97.8 F (36.6 C), resp. rate 14, weight 189 lb 4 oz (85.8 kg), SpO2 97%. Body mass index is 29.64 kg/m.    Physical Exam Vitals reviewed.  Constitutional:      Appearance: She is well-developed.     Comments: Very pleasant. Delightful.   HENT:     Head: Normocephalic and atraumatic.     Right Ear: Tympanic membrane, ear canal and external ear normal. No drainage, swelling or tenderness. Tympanic membrane is not injected, scarred, erythematous, retracted or bulging.     Left Ear: Tympanic membrane, ear canal and external ear normal. No drainage, swelling or tenderness. Tympanic membrane is not injected, scarred, erythematous, retracted or bulging.     Nose: No nasal deformity, septal deviation, mucosal edema or rhinorrhea.     Right Turbinates: Enlarged, swollen and pale.     Left Turbinates: Enlarged, swollen and pale.     Right Sinus: No maxillary sinus tenderness or frontal sinus tenderness.     Left Sinus: No maxillary sinus tenderness or frontal sinus tenderness.     Comments: Turbinates are somewhat enlarged.  No polyps appreciated.    Mouth/Throat:     Mouth: Mucous membranes are not pale and not dry.     Pharynx: Uvula midline.  Eyes:     General:         Right eye: No discharge.        Left eye: No discharge.     Conjunctiva/sclera: Conjunctivae normal.     Right eye: Right conjunctiva is not injected. No chemosis.    Left eye: Left conjunctiva is not injected. No chemosis.    Pupils: Pupils are equal, round, and reactive to light.  Cardiovascular:     Rate and Rhythm: Normal rate and regular rhythm.     Heart sounds: Normal heart sounds.  Pulmonary:     Effort: Pulmonary effort is normal. No tachypnea, accessory muscle usage or respiratory distress.     Breath sounds: Normal breath sounds. No wheezing, rhonchi or rales.     Comments: Moving air well in all lung fields. No increased work of breathing noted. No increased work of breathing noted.  Intermittent coughing during the visit. Chest:     Chest wall: No tenderness.  Abdominal:  Tenderness: There is no abdominal tenderness. There is no guarding or rebound.  Lymphadenopathy:     Head:     Right side of head: No submandibular, tonsillar or occipital adenopathy.     Left side of head: No submandibular, tonsillar or occipital adenopathy.     Cervical: No cervical adenopathy.  Skin:    General: Skin is warm.     Capillary Refill: Capillary refill takes less than 2 seconds.     Coloration: Skin is not pale.     Findings: No abrasion, erythema, petechiae or rash. Rash is not papular, urticarial or vesicular.  Neurological:     Mental Status: She is alert.  Psychiatric:        Behavior: Behavior is cooperative.      Diagnostic studies:    Spirometry: results normal (FEV1: 3.02/90%, FVC: 3.71/90%, FEV1/FVC: 81%).    Spirometry consistent with normal pattern.   Allergy Studies: none       Drexel Gentles, MD  [facility] 

## 2023-12-30 NOTE — Patient Instructions (Addendum)
 1. Moderate persistent asthma, uncomplicated - Lung looked great today.  - Daily controller medication(s): Singulair  10mg  daily and Advair  100/50 one puff once daily.  - Prior to physical activity: albuterol  2 puffs 10-15 minutes before physical activity. - Changes during respiratory infections or worsening symptoms: Increase Advair  100/5 to 1 puff twice daily for TWO WEEKS. - Asthma control goals:  * Full participation in all desired activities (may need albuterol  before activity) * Albuterol  use two time or less a week on average (not counting use with activity) * Cough interfering with sleep two time or less a month * Oral steroids no more than once a year * No hospitalizations  2. Seasonal and perennial allergic rhinitis (mouse, [place] pig, weeds, grasses, outdoor molds and dust mites) - Continue levocetirizine 5 mg daily. - Continue with montelukast  10mg  daily. - We are going to send in Xhance  to [facility], so let me know how much it is if you get a chance.  - Consider allergy shots for long term control.   3. Nasal polyposis - Continue Dupixent  as above - Continue Xhance  one spray per nostril twice daily.  4. Return in about 6 months (around 06/28/2024). You can have the follow up appointment with Dr. Idolina Maker or a Nurse Practicioner (our Nurse Practitioners are excellent and always have Physician oversight!).    Please inform us  of any Emergency Department visits, hospitalizations, or changes in symptoms. Call us  before going to the ED for breathing or allergy symptoms since we might be able to fit you in for a sick visit. Feel free to contact us  anytime with any questions, problems, or concerns.  It was a pleasure to see you again today!  Websites that have reliable patient information: 1. American Academy of Asthma, Allergy, and Immunology: www.aaaai.org 2. Food Allergy Research and Education (FARE): foodallergy.org 3. Mothers of Asthmatics:  http://www.asthmacommunitynetwork.org 4. American College of Allergy, Asthma, and Immunology: www.acaai.org      "Like" us  on Facebook and Instagram for our latest updates!      A healthy democracy works best when [employer] participate! Make sure you are registered to vote! If you have moved or changed any of your contact information, you will need to get this updated before voting! Scan the QR codes below to learn more!

## 2024-01-11 ENCOUNTER — Other Ambulatory Visit (HOSPITAL_COMMUNITY): Payer: Self-pay

## 2024-01-13 ENCOUNTER — Other Ambulatory Visit: Payer: Self-pay

## 2024-01-13 ENCOUNTER — Other Ambulatory Visit (HOSPITAL_COMMUNITY): Payer: Self-pay

## 2024-01-14 ENCOUNTER — Other Ambulatory Visit: Payer: Self-pay

## 2024-01-14 NOTE — Progress Notes (Signed)
Specialty Pharmacy Refill Coordination Note  Sara Hamilton is a 38 y.o. female contacted today regarding refills of specialty medication(s) Dupilumab (Dupixent)   Patient requested Delivery   Delivery date: 01/28/24   Verified address: [address]   [place] [id number]   Medication will be filled on 01/27/24.

## 2024-01-15 ENCOUNTER — Other Ambulatory Visit (HOSPITAL_COMMUNITY): Payer: Self-pay

## 2024-01-15 ENCOUNTER — Other Ambulatory Visit: Payer: Self-pay

## 2024-01-15 MED ORDER — SPIRONOLACTONE 50 MG PO TABS
50.0000 mg | ORAL_TABLET | Freq: Every day | ORAL | 0 refills | Status: DC
  Filled 2024-01-15: qty 30, 30d supply, fill #0

## 2024-01-26 ENCOUNTER — Encounter: Payer: Self-pay | Admitting: Allergy & Immunology

## 2024-01-27 ENCOUNTER — Other Ambulatory Visit (HOSPITAL_COMMUNITY): Payer: Self-pay

## 2024-01-27 ENCOUNTER — Other Ambulatory Visit: Payer: Self-pay

## 2024-02-02 ENCOUNTER — Other Ambulatory Visit (HOSPITAL_COMMUNITY): Payer: Self-pay

## 2024-02-02 ENCOUNTER — Other Ambulatory Visit: Payer: Self-pay

## 2024-02-02 MED ORDER — BUDESONIDE 0.5 MG/2ML IN SUSP
RESPIRATORY_TRACT | 5 refills | Status: DC
  Filled 2024-02-02: qty 120, 30d supply, fill #0

## 2024-02-08 ENCOUNTER — Other Ambulatory Visit (HOSPITAL_COMMUNITY): Payer: Self-pay

## 2024-02-22 ENCOUNTER — Other Ambulatory Visit (HOSPITAL_COMMUNITY): Payer: Self-pay

## 2024-02-22 ENCOUNTER — Other Ambulatory Visit: Payer: Self-pay

## 2024-02-22 NOTE — Progress Notes (Signed)
 Specialty Pharmacy Refill Coordination Note  Sara Hamilton is a 38 y.o. female contacted today regarding refills of specialty medication(s) No data recorded  Patient requested (Patient-Rptd) Delivery   Delivery date: (Patient-Rptd) 02/25/24   Verified address: (Patient-Rptd) [address] [place], [place] [address]   Medication will be filled on 02/24/24.

## 2024-03-07 ENCOUNTER — Other Ambulatory Visit (HOSPITAL_COMMUNITY): Payer: Self-pay

## 2024-03-24 ENCOUNTER — Other Ambulatory Visit: Payer: Self-pay

## 2024-03-24 ENCOUNTER — Other Ambulatory Visit (HOSPITAL_COMMUNITY): Payer: Self-pay

## 2024-03-24 NOTE — Progress Notes (Signed)
 Specialty Pharmacy Ongoing Clinical Assessment Note  Sara Hamilton is a 38 y.o. female who is being followed by the specialty pharmacy service for RxSp Asthma/COPD   Patient's specialty medication(s) reviewed today: Dupilumab (Dupixent)   Missed doses in the last 4 weeks: 0   Patient/Caregiver did not have any additional questions or concerns.   Therapeutic benefit summary: Patient is achieving benefit   Adverse events/side effects summary: No adverse events/side effects   Patient's therapy is appropriate to: Continue    Goals Addressed             This Visit's Progress    Minimize recurrence of flares   On track    Patient is on track. Patient will maintain adherence. [name] reports she is well-controlled at this time with no recent flares.         Follow up:  6 months  Otto Herb Specialty Pharmacist

## 2024-03-24 NOTE — Progress Notes (Signed)
 Specialty Pharmacy Refill Coordination Note  Sara Hamilton is a 38 y.o. female contacted today regarding refills of specialty medication(s) Dupilumab (Dupixent)   Patient requested Delivery   Delivery date: 03/31/24   Verified address: [address] [place], [place] [address]   Medication will be filled on 03/30/24.

## 2024-03-30 ENCOUNTER — Other Ambulatory Visit: Payer: Self-pay

## 2024-04-05 ENCOUNTER — Other Ambulatory Visit (HOSPITAL_COMMUNITY): Payer: Self-pay

## 2024-04-21 ENCOUNTER — Other Ambulatory Visit: Payer: Self-pay | Admitting: Pharmacy Technician

## 2024-04-21 ENCOUNTER — Other Ambulatory Visit: Payer: Self-pay

## 2024-04-21 ENCOUNTER — Other Ambulatory Visit (HOSPITAL_COMMUNITY): Payer: Self-pay

## 2024-04-21 NOTE — Progress Notes (Signed)
 Specialty Pharmacy Refill Coordination Note  Sara Hamilton is a 38 y.o. female contacted today regarding refills of specialty medication(s) Dupilumab  (Dupixent )   Patient requested Delivery   Delivery date: 04/27/24   Verified address: [address] [place],  [address]   Medication will be filled on 04/26/24.

## 2024-04-26 ENCOUNTER — Other Ambulatory Visit: Payer: Self-pay

## 2024-05-04 ENCOUNTER — Other Ambulatory Visit (HOSPITAL_COMMUNITY): Payer: Self-pay

## 2024-05-24 ENCOUNTER — Encounter (INDEPENDENT_AMBULATORY_CARE_PROVIDER_SITE_OTHER): Payer: Self-pay

## 2024-05-24 ENCOUNTER — Other Ambulatory Visit: Payer: Self-pay

## 2024-05-24 ENCOUNTER — Other Ambulatory Visit: Payer: Self-pay | Admitting: Pharmacy Technician

## 2024-05-24 NOTE — Progress Notes (Signed)
 Specialty Pharmacy Refill Coordination Note  Sara Hamilton is a 38 y.o. female contacted today regarding refills of specialty medication(s) Dupilumab  (Dupixent )   Patient requested Delivery   Delivery date: 05/27/24   Verified address: [address] [place], [place] [address]   Medication will be filled on 05/26/24.

## 2024-05-26 ENCOUNTER — Other Ambulatory Visit: Payer: Self-pay

## 2024-06-15 ENCOUNTER — Other Ambulatory Visit: Payer: Self-pay

## 2024-06-15 ENCOUNTER — Other Ambulatory Visit (HOSPITAL_COMMUNITY): Payer: Self-pay

## 2024-06-20 ENCOUNTER — Other Ambulatory Visit: Payer: Self-pay | Admitting: Pharmacy Technician

## 2024-06-20 ENCOUNTER — Other Ambulatory Visit (HOSPITAL_COMMUNITY): Payer: Self-pay

## 2024-06-20 ENCOUNTER — Encounter (INDEPENDENT_AMBULATORY_CARE_PROVIDER_SITE_OTHER): Payer: Self-pay

## 2024-06-20 ENCOUNTER — Other Ambulatory Visit: Payer: Self-pay

## 2024-06-20 NOTE — Progress Notes (Signed)
 Specialty Pharmacy Refill Coordination Note  Sara Hamilton is a 38 y.o. female contacted today regarding refills of specialty medication(s) Dupilumab  (Dupixent )   Patient requested (Patient-Rptd) Delivery   Delivery date: 06/22/2024 Verified address: (Patient-Rptd) [address] [place] [address]   Medication will be filled on 06/21/2024.

## 2024-07-06 ENCOUNTER — Ambulatory Visit: Payer: Commercial Managed Care - PPO | Admitting: Allergy & Immunology

## 2024-07-11 ENCOUNTER — Other Ambulatory Visit (HOSPITAL_COMMUNITY): Payer: Self-pay

## 2024-07-12 ENCOUNTER — Encounter (INDEPENDENT_AMBULATORY_CARE_PROVIDER_SITE_OTHER): Payer: Self-pay

## 2024-07-12 ENCOUNTER — Other Ambulatory Visit: Payer: Self-pay

## 2024-07-12 NOTE — Progress Notes (Signed)
 Specialty Pharmacy Refill Coordination Note  Sara Hamilton is a 38 y.o. female contacted today regarding refills of specialty medication(s) Dupilumab  (Dupixent )   Patient requested Delivery   Delivery date: 07/14/24   Verified address: [address] [place], [place] [address]   Medication will be filled on 07/13/24.

## 2024-07-15 ENCOUNTER — Ambulatory Visit: Admitting: Adult Health

## 2024-07-15 ENCOUNTER — Other Ambulatory Visit (HOSPITAL_COMMUNITY)
Admission: RE | Admit: 2024-07-15 | Discharge: 2024-07-15 | Disposition: A | Discharge status: Home / Self Care | Source: Ambulatory Visit | Attending: Adult Health | Admitting: Adult Health

## 2024-07-15 ENCOUNTER — Encounter: Payer: Self-pay | Admitting: Adult Health

## 2024-07-15 VITALS — BP 122/82 | HR 69 | Ht 66.0 in | Wt 185.0 lb

## 2024-07-15 DIAGNOSIS — Z01419 Encounter for gynecological examination (general) (routine) without abnormal findings: Secondary | ICD-10-CM | POA: Diagnosis not present

## 2024-07-15 DIAGNOSIS — Z1331 Encounter for screening for depression: Secondary | ICD-10-CM

## 2024-07-15 NOTE — Progress Notes (Signed)
 Patient ID: Sara Hamilton, female   DOB: 1985-12-17, 38 y.o.   MRN: [id number] History of Present Illness: Julianny is a 38 year old white female,married, G2P1011, in for a well woman gyn exam and pap.  PCP is Dr Shona.    Current Medications, Allergies, Past Medical History, Past Surgical History, Family History and Social History were reviewed in [employer] record.     Review of Systems: Patient denies any headaches, hearing loss, fatigue, blurred vision, shortness of breath, chest pain, abdominal pain, problems with bowel movements, urination, or intercourse. No joint pain or mood swings.  She is starting to have more acne, and occasional hot flashes    Physical Exam:BP 122/82 (BP Location: Right Arm, Patient Position: Sitting, Cuff Size: Normal)   Pulse 69   Ht 5' 6 (1.676 m)   Wt 185 lb (83.9 kg)   LMP 06/20/2024 (Exact Date)   BMI 29.86 kg/m   General:  Well developed, well nourished, no acute distress Skin:  Warm and dry Neck:  Midline trachea, normal thyroid, good ROM, no lymphadenopathy Lungs; Clear to auscultation bilaterally Breast:  No dominant palpable mass, retraction, or nipple discharge Cardiovascular: Regular rate and rhythm Abdomen:  Soft, non tender, no hepatosplenomegaly Pelvic:  External genitalia is normal in appearance, no lesions.  The vagina is normal in appearance. Urethra has no lesions or masses. The cervix is bulbous,pap with HR HPV genotyping performed.  Uterus is felt to be normal size, shape, and contour.  No adnexal masses or tenderness noted.Bladder is non tender, no masses felt. Extremities/musculoskeletal:  No swelling or varicosities noted, no clubbing or cyanosis Psych:  No mood changes, alert and cooperative,seems happy AA is 0 Fall risk is low    07/15/2024   10:50 AM 08/07/2021    1:29 PM 12/30/2018    1:44 PM  Depression screen PHQ 2/9  Decreased Interest 0 0 0  Down, Depressed, Hopeless 0 0 0  PHQ - 2 Score 0 0 0   Altered sleeping 0 0   Tired, decreased energy 0 0   Change in appetite 0 0   Feeling bad or failure about yourself  0 0   Trouble concentrating 0 0   Moving slowly or fidgety/restless 0 0   Suicidal thoughts 0 0   PHQ-9 Score 0 0        07/15/2024   10:50 AM 08/07/2021    1:29 PM  GAD 7 : Generalized Anxiety Score  Nervous, Anxious, on Edge 0 0  Control/stop worrying 0 0  Worry too much - different things 0 0  Trouble relaxing 0 0  Restless 0 0  Easily annoyed or irritable 0 0  Afraid - awful might happen 0 0  Total GAD 7 Score 0 0      Upstream - 07/15/24 1056       Pregnancy Intention Screening   Does the patient want to become pregnant in the next year? No    Does the patient's partner want to become pregnant in the next year? No    Would the patient like to discuss contraceptive options today? No      Contraception Wrap Up   Current Method Vasectomy    End Method Vasectomy    Contraception Counseling Provided No         Examination chaperoned by Clarita Salt LPN   Impression and plan: 1. Encounter for gynecological examination with Papanicolaou smear of cervix (Primary) Pap sent Pap in 3 years if  normal Physical here in 3 years Labs with PCP, physical  Discussed perimenopause  - Cytology - PAP( [place])

## 2024-07-18 ENCOUNTER — Ambulatory Visit: Admitting: Internal Medicine

## 2024-07-18 ENCOUNTER — Other Ambulatory Visit: Payer: Self-pay

## 2024-07-18 ENCOUNTER — Other Ambulatory Visit (HOSPITAL_COMMUNITY): Payer: Self-pay

## 2024-07-18 ENCOUNTER — Encounter: Payer: Self-pay | Admitting: Internal Medicine

## 2024-07-18 VITALS — BP 120/72 | HR 63 | Temp 98.1°F | Resp 18 | Ht 66.14 in | Wt 184.0 lb

## 2024-07-18 DIAGNOSIS — J4540 Moderate persistent asthma, uncomplicated: Secondary | ICD-10-CM | POA: Diagnosis not present

## 2024-07-18 DIAGNOSIS — J339 Nasal polyp, unspecified: Secondary | ICD-10-CM | POA: Diagnosis not present

## 2024-07-18 DIAGNOSIS — J3089 Other allergic rhinitis: Secondary | ICD-10-CM

## 2024-07-18 DIAGNOSIS — J302 Other seasonal allergic rhinitis: Secondary | ICD-10-CM

## 2024-07-18 MED ORDER — MONTELUKAST SODIUM 10 MG PO TABS
10.0000 mg | ORAL_TABLET | Freq: Every day | ORAL | 1 refills | Status: AC
  Filled 2024-07-18 – 2024-11-17 (×2): qty 90, 90d supply, fill #0

## 2024-07-18 MED ORDER — FLUTICASONE PROPIONATE 50 MCG/ACT NA SUSP
2.0000 | Freq: Every day | NASAL | 5 refills | Status: AC
  Filled 2024-07-18: qty 16, 30d supply, fill #0
  Filled 2024-09-16: qty 16, 30d supply, fill #1
  Filled 2024-10-19: qty 16, 30d supply, fill #2

## 2024-07-18 MED ORDER — LEVOCETIRIZINE DIHYDROCHLORIDE 5 MG PO TABS
5.0000 mg | ORAL_TABLET | Freq: Every evening | ORAL | 1 refills | Status: AC
  Filled 2024-07-18 – 2024-09-16 (×2): qty 90, 90d supply, fill #0
  Filled 2024-12-19: qty 90, 90d supply, fill #1
  Filled 2024-12-19: qty 90, 90d supply, fill #0

## 2024-07-18 MED ORDER — FLUTICASONE-SALMETEROL 100-50 MCG/ACT IN AEPB
1.0000 | INHALATION_SPRAY | Freq: Two times a day (BID) | RESPIRATORY_TRACT | 5 refills | Status: AC
  Filled 2024-07-18: qty 60, 30d supply, fill #0
  Filled 2024-09-16: qty 60, 30d supply, fill #1
  Filled 2024-10-19: qty 60, 30d supply, fill #2
  Filled 2024-12-19: qty 60, 30d supply, fill #0
  Filled 2024-12-19: qty 60, 30d supply, fill #3

## 2024-07-18 MED ORDER — ALBUTEROL SULFATE HFA 108 (90 BASE) MCG/ACT IN AERS
2.0000 | INHALATION_SPRAY | Freq: Four times a day (QID) | RESPIRATORY_TRACT | 1 refills | Status: AC | PRN
  Filled 2024-07-18: qty 6.7, 25d supply, fill #0

## 2024-07-18 NOTE — Progress Notes (Signed)
 FOLLOW UP Date of Service/Encounter:  07/18/24   Subjective:  Sara Hamilton (DOB: 1986-11-20) is a 38 y.o. female who [facility] for follow up for asthma, CRS with nasal polyps allergic rhinitis.   History obtained from: chart review and patient. Last seen on 12/30/2023 with Dr Iva Asthma- controlled on Singulair /Advair /Dupixent  CRS with NP/Allergies- controlled on Xhance /Dupixent /Xyzal /Singulair   Since last visit, reports asthma is doing well.  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  Not much trouble with flare ups, last use of Albuterol  was over a year ago. No ER/urgent care/oral prednisone  use. Takes Advair  1 puff daily and is controlled, sometimes forgets.  Does increase it to twice daily with illness because otherwise she has a prolonged cough; this has helped prevent the need for oral prednisone .   Does note some post nasal drip and congestion that has been there for many years and not too bothersome.  Xhance  cost has gone up significantly so Dr Iva sent in pulmicort  rinses; she has not needed this and instead is using Flonase .  Also on Xyzal  and Singulair .  Taking Dupixent  every 2 weeks at home and reports compliance, no side effects noted.    Past Medical History: Past Medical History:  Diagnosis Date   Asthma    Chronic pansinusitis    GERD (gastroesophageal reflux disease)    Vaginal Pap smear, abnormal     Objective:  BP 120/72 (BP Location: Left Arm, Patient Position: Sitting, Cuff Size: Normal)   Pulse 63   Temp 98.1 F (36.7 C) (Temporal)   Resp 18   Ht 5' 6.14 (1.68 m)   Wt 184 lb (83.5 kg)   LMP 06/20/2024 (Exact Date)   SpO2 98%   BMI 29.57 kg/m  Body mass index is 29.57 kg/m. Physical Exam: GEN: alert, well developed HEENT: clear conjunctiva, nose without inferior turbinate hypertrophy, pink nasal mucosa, no rhinorrhea, no cobblestoning HEART: regular rate and rhythm, no murmur LUNGS: clear to auscultation  bilaterally, no coughing, unlabored respiration SKIN: no rashes or lesions  Spirometry:  Tracings reviewed. Her effort: Good reproducible efforts. FVC:4.04 L, 98% predicted  FEV1: 3.11L, 92% predicted FEV1/FVC ratio: 77% Interpretation: Spirometry consistent with normal pattern.  Please see scanned spirometry results for details.  Assessment:   1. Seasonal and perennial allergic rhinitis   2. Nasal polyposis   3. Moderate persistent asthma, uncomplicated     Plan/Recommendations:  Moderate persistent asthma - Well controlled on combination therapy.  Spirometry today was normal.  - Daily controller medication(s): Singulair  10mg  daily, Advair  100/50 one puff once daily and Dupixent  300mg  every 2 weeks.  - Prior to physical activity: albuterol  2 puffs 10-15 minutes before physical activity. - Changes during respiratory infections or worsening symptoms: Increase Advair  100/5 to 1 puff twice daily for TWO WEEKS. - Asthma control goals:  * Full participation in all desired activities (may need albuterol  before activity) * Albuterol  use two time or less a week on average (not counting use with activity) * Cough interfering with sleep two time or less a month * Oral steroids no more than once a year * No hospitalizations  Seasonal and perennial allergic rhinitis  CRS with nasal polyposis - Controlled.   - SPT 2019 positive to mouse, israel pig, weeds, grasses, outdoor molds and dust mites - Use nasal saline rinses before nose sprays such as with Neilmed Sinus Rinse.  Use distilled water.   - Use Flonase  2 sprays each nostril daily. Aim upward  and outward. Can try the Pulmicort  rinses if symptoms worsen.  - Use Xyzal  5 mg daily.  - Use Singulair  10mg  daily.  Stop if there are any mood/behavioral changes. - Continue Dupixent  300mg  every 2 weeks.   - Consider allergy shots as long term control of your symptoms by teaching your immune system to be more tolerant of your allergy triggers.                 Return in about 6 months (around 01/18/2025).  Arleta Blanch, MD [facility] 

## 2024-07-18 NOTE — Patient Instructions (Addendum)
 Moderate persistent asthma - Daily controller medication(s): Singulair  10mg  daily, Advair  100/50 one puff once daily and Dupixent  300mg  every 2 weeks.  - Prior to physical activity: albuterol  2 puffs 10-15 minutes before physical activity. - Changes during respiratory infections or worsening symptoms: Increase Advair  100/5 to 1 puff twice daily for TWO WEEKS. - Asthma control goals:  * Full participation in all desired activities (may need albuterol  before activity) * Albuterol  use two time or less a week on average (not counting use with activity) * Cough interfering with sleep two time or less a month * Oral steroids no more than once a year * No hospitalizations  Seasonal and perennial allergic rhinitis  CRS with nasal polyposis - SPT 2019 positive to mouse, israel pig, weeds, grasses, outdoor molds and dust mites - Use nasal saline rinses before nose sprays such as with Neilmed Sinus Rinse.  Use distilled water.   - Use Flonase  2 sprays each nostril daily. Aim upward and outward. - Use Xyzal  5 mg daily.  - Use Singulair  10mg  daily.  Stop if there are any mood/behavioral changes. - Continue Dupixent  300mg  every 2 weeks.   - Consider allergy shots as long term control of your symptoms by teaching your immune system to be more tolerant of your allergy triggers.

## 2024-07-20 ENCOUNTER — Ambulatory Visit: Payer: Self-pay | Admitting: Adult Health

## 2024-07-20 LAB — CYTOLOGY - PAP
Comment: NEGATIVE
Diagnosis: NEGATIVE
High risk HPV: NEGATIVE

## 2024-08-01 ENCOUNTER — Encounter (INDEPENDENT_AMBULATORY_CARE_PROVIDER_SITE_OTHER): Payer: Self-pay

## 2024-08-01 ENCOUNTER — Other Ambulatory Visit (HOSPITAL_COMMUNITY): Payer: Self-pay

## 2024-08-02 ENCOUNTER — Other Ambulatory Visit: Payer: Self-pay

## 2024-08-02 NOTE — Progress Notes (Signed)
 Specialty Pharmacy Refill Coordination Note  Sara Hamilton is a 38 y.o. female contacted today regarding refills of specialty medication(s) Dupilumab  (Dupixent )   Patient requested (Patient-Rptd) Delivery   Delivery date: 08/19/24   Verified address: (Patient-Rptd) [address] [place] [address]   Medication will be filled on 09.04.25.   Next injection is 9.12.25.

## 2024-08-18 ENCOUNTER — Other Ambulatory Visit: Payer: Self-pay

## 2024-09-07 ENCOUNTER — Other Ambulatory Visit (HOSPITAL_COMMUNITY): Payer: Self-pay

## 2024-09-13 ENCOUNTER — Other Ambulatory Visit: Payer: Self-pay | Admitting: Allergy & Immunology

## 2024-09-13 ENCOUNTER — Other Ambulatory Visit: Payer: Self-pay

## 2024-09-14 ENCOUNTER — Other Ambulatory Visit (HOSPITAL_COMMUNITY): Payer: Self-pay

## 2024-09-14 ENCOUNTER — Other Ambulatory Visit: Payer: Self-pay

## 2024-09-14 ENCOUNTER — Ambulatory Visit: Attending: Internal Medicine | Admitting: Pharmacist

## 2024-09-14 ENCOUNTER — Other Ambulatory Visit: Payer: Self-pay | Admitting: Allergy & Immunology

## 2024-09-14 DIAGNOSIS — Z79899 Other long term (current) drug therapy: Secondary | ICD-10-CM

## 2024-09-14 MED ORDER — DUPIXENT 300 MG/2ML ~~LOC~~ SOSY
300.0000 mg | PREFILLED_SYRINGE | SUBCUTANEOUS | 11 refills | Status: DC
  Filled 2024-09-14: qty 4, 28d supply, fill #0

## 2024-09-14 MED ORDER — DUPIXENT 300 MG/2ML ~~LOC~~ SOSY
300.0000 mg | PREFILLED_SYRINGE | SUBCUTANEOUS | 11 refills | Status: AC
  Filled 2024-09-14: qty 4, 28d supply, fill #0
  Filled 2024-10-13: qty 4, 28d supply, fill #1
  Filled 2024-11-15: qty 4, 28d supply, fill #2
  Filled 2024-12-13: qty 4, 28d supply, fill #3
  Filled 2025-01-10: qty 4, 28d supply, fill #4

## 2024-09-14 NOTE — Progress Notes (Signed)
   S: Patient presents for review of their specialty medication therapy.  Patient is currently taking Dupixent  for asthma/nasal polyposis. Patient is managed by Dr. Iva for this.   Adherence: confirms.   Efficacy: reports good efficacy with the Dupixent .  Dosing: 600 mg subq x1 followed by 300 mg q14days  Dose adjustments: Renal: no dose adjustments (has not been studied) Hepatic: no dose adjustments (has not been studied)  Drug-drug interactions: none identified   Screening: TB test: completed   Monitoring: S/sx of infection: none S/sx of hypersensitivity: none S/sx of ocular effects: none S/sx of eosinophilia/vasculitis: none  O:     Lab Results  Component Value Date   WBC 7.8 10/11/2021   HGB 12.7 10/11/2021   HCT 37.3 10/11/2021   MCV 92 10/11/2021   PLT 230 07/03/2011      Chemistry   No results found for: NA, K, CL, CO2, BUN, CREATININE, GLU No results found for: CALCIUM, ALKPHOS, AST, ALT, BILITOT     A/P: 1. Medication review: Patient currently on Dupixent  for asthma/nasal polyposis. Reviewed the medication with the patient, including the following: Dupixent  is a monoclonal antibody used for the treatment of asthma or atopic dermatitis. Patient educated on purpose, proper use and potential adverse effects of Dupixent . Possible adverse effects include increased risk of infection, ocular effects, vasculitis/eosinophilia, and hypersensitivity reactions. Administer as a SubQ injection and rotate sites. Allow the medication to reach room temp prior to administration (45 mins for 300 mg syringe or 30 min for 200 mg syringe). Do not shake. Discard any unused portion. No recommendations for any changes.   Herlene Fleeta Morris, PharmD, JAQUELINE, CPP Clinical Pharmacist [facility] [phone]

## 2024-09-14 NOTE — Progress Notes (Signed)
 Specialty Pharmacy Refill Coordination Note  Sara Hamilton is a 38 y.o. female contacted today regarding refills of specialty medication(s) Dupilumab  (Dupixent )   Patient requested Delivery   Delivery date: 09/23/24   Verified address: [address] [place] [address]   Medication will be filled on 10.09.25.    This fill date is pending response to refill request from provider. Patient is aware and if they have not received fill by intended date they must follow up with pharmacy.

## 2024-09-16 ENCOUNTER — Other Ambulatory Visit (HOSPITAL_COMMUNITY): Payer: Self-pay

## 2024-09-16 ENCOUNTER — Other Ambulatory Visit: Payer: Self-pay

## 2024-09-22 ENCOUNTER — Other Ambulatory Visit: Payer: Self-pay

## 2024-09-24 ENCOUNTER — Telehealth: Admitting: Physician Assistant

## 2024-09-24 DIAGNOSIS — J4521 Mild intermittent asthma with (acute) exacerbation: Secondary | ICD-10-CM | POA: Diagnosis not present

## 2024-09-24 MED ORDER — AZITHROMYCIN 250 MG PO TABS
ORAL_TABLET | ORAL | 0 refills | Status: AC
Start: 2024-09-24 — End: 2024-09-29

## 2024-09-24 MED ORDER — PREDNISONE 20 MG PO TABS: 40.0000 mg | ORAL_TABLET | Freq: Every day | ORAL | 0 refills | Status: DC

## 2024-09-24 NOTE — Patient Instructions (Signed)
 Sara Hamilton, thank you for joining Harlene PEDLAR Ward, PA-C for today's virtual visit.  While this provider is not your primary care provider (PCP), if your PCP is located in our provider database this encounter information will be shared with them immediately following your visit.   A [facility] MyChart account gives you access to today's visit and all your visits, tests, and labs performed at [facility]  click here if you don't have a [place] [facility] MyChart account or go to [email or web address]  Consent: (Patient) Sara Hamilton provided verbal consent for this virtual visit at the beginning of the encounter.  Current Medications:  Current Outpatient Medications:    azithromycin (ZITHROMAX) 250 MG tablet, Take 2 tablets on day 1, then 1 tablet daily on days 2 through 5, Disp: 6 tablet, Rfl: 0   predniSONE  (DELTASONE ) 20 MG tablet, Take 2 tablets (40 mg total) by mouth daily with breakfast for 5 days., Disp: 10 tablet, Rfl: 0   albuterol  (VENTOLIN  HFA) 108 (90 Base) MCG/ACT inhaler, Inhale 2 puffs into the lungs every 6 (six) hours as needed for wheezing or shortness of breath., Disp: 6.7 g, Rfl: 1   dupilumab  (DUPIXENT ) 300 MG/2ML prefilled syringe, Inject 300 mg into the skin every 14 (fourteen) days., Disp: 4 mL, Rfl: 11   Famotidine  (PEPCID  PO), Take by mouth., Disp: , Rfl:    fluticasone  (FLONASE  ALLERGY RELIEF) 50 MCG/ACT nasal spray, Place 2 sprays into both nostrils daily., Disp: 16 g, Rfl: 5   fluticasone -salmeterol (ADVAIR ) 100-50 MCG/ACT AEPB, Inhale 1 puff into the lungs 2 (two) times daily., Disp: 60 each, Rfl: 5   levocetirizine (XYZAL ) 5 MG tablet, Take 1 tablet (5 mg total) by mouth every evening., Disp: 90 tablet, Rfl: 1   montelukast  (SINGULAIR ) 10 MG tablet, Take 1 tablet (10 mg total) by mouth at bedtime., Disp: 90 tablet, Rfl: 1   Medications ordered in this encounter:  Meds ordered this encounter  Medications   azithromycin (ZITHROMAX) 250 MG tablet     Sig: Take 2 tablets on day 1, then 1 tablet daily on days 2 through 5    Dispense:  6 tablet    Refill:  0    Supervising Provider:   LAMPTEY, PHILIP O [[id number]]   predniSONE  (DELTASONE ) 20 MG tablet    Sig: Take 2 tablets (40 mg total) by mouth daily with breakfast for 5 days.    Dispense:  10 tablet    Refill:  0    Supervising Provider:   BLAISE ALEENE KIDD [[id number]]     *If you need refills on other medications prior to your next appointment, please contact your pharmacy*  Follow-Up: Call back or seek an in-person evaluation if the symptoms worsen or if the condition fails to improve as anticipated.  [facility] [phone]  Other Instructions Take antibiotic and prednisone  as prescribed.  If no improvement or symptoms become worse please be seen in person with primary care physician or urgent care.    If you have been instructed to have an in-person evaluation today at a local Urgent Care facility, please use the link below. It will take you to a list of all of our available [facility], including address, phone number and hours of operation. Please do not delay care.   Urgent Cares  If you or a family member do not have a primary care provider, use the link below to schedule a visit and establish care. When you choose  a [facility] [place] primary care physician or advanced practice provider, you gain a long-term partner in health. Find a Primary Care Provider  Learn more about [facility]'s in-office and virtual care options: [facility] - Get Care Now

## 2024-09-24 NOTE — Progress Notes (Signed)
 Virtual Visit Consent   Sara Hamilton, you are scheduled for a virtual visit with a  provider today. Just as with appointments in the office, your consent must be obtained to participate. Your consent will be active for this visit and any virtual visit you may have with one of our providers in the next 365 days. If you have a MyChart account, a copy of this consent can be sent to you electronically.  As this is a virtual visit, video technology does not allow for your provider to perform a traditional examination. This may limit your provider's ability to fully assess your condition. If your provider identifies any concerns that need to be evaluated in person or the need to arrange testing (such as labs, EKG, etc.), we will make arrangements to do so. Although advances in technology are sophisticated, we cannot ensure that it will always work on either your end or our end. If the connection with a video visit is poor, the visit may have to be switched to a telephone visit. With either a video or telephone visit, we are not always able to ensure that we have a secure connection.  By engaging in this virtual visit, you consent to the provision of healthcare and authorize for your insurance to be billed (if applicable) for the services provided during this visit. Depending on your insurance coverage, you may receive a charge related to this service.  I need to obtain your verbal consent now. Are you willing to proceed with your visit today? Sara Hamilton has provided verbal consent on 09/24/2024 for a virtual visit (video or telephone). Harlene PEDLAR Ward, PA-C  Date: 09/24/2024 10:55 AM   Virtual Visit via Video Note   I, Harlene PEDLAR Ward, connected with  Sara Hamilton  ([id number], January 10, 1986) on 09/24/24 at 10:45 AM EDT by a video-enabled telemedicine application and verified that I am speaking with the correct person using two identifiers.  Location: Patient: Virtual Visit Location Patient:  Home Provider: Virtual Visit Location Provider: Home Office   I discussed the limitations of evaluation and management by telemedicine and the availability of in person appointments. The patient expressed understanding and agreed to proceed.    History of Present Illness: Sara Hamilton is a 38 y.o. who identifies as a female who was assigned female at birth, and is being seen today for cough and congestion that started about one week ago.  She reports cough is worse at night.  She has a h/o asthma and has had to use her rescue inhaler. Covid and flu test negative.  She denies fever, chills.    HPI: HPI  Problems:  Patient Active Problem List   Diagnosis Date Noted   Nasal polyposis 10/02/2021   Encounter for gynecological examination with Papanicolaou smear of cervix 08/07/2021   Cough variant asthma with ? UACS /cyclical cough component  07/04/2019   Upper airway cough syndrome 05/01/2018   Seasonal and perennial allergic rhinitis 04/28/2018   Moderate persistent asthma, uncomplicated 04/28/2018   Contraceptive management 11/07/2013   Supervision of normal pregnancy 07/02/2011    Allergies: No Known Allergies Medications:  Current Outpatient Medications:    azithromycin (ZITHROMAX) 250 MG tablet, Take 2 tablets on day 1, then 1 tablet daily on days 2 through 5, Disp: 6 tablet, Rfl: 0   predniSONE  (DELTASONE ) 20 MG tablet, Take 2 tablets (40 mg total) by mouth daily with breakfast for 5 days., Disp: 10 tablet, Rfl: 0   albuterol  (VENTOLIN  HFA) 108 (90 Base)  MCG/ACT inhaler, Inhale 2 puffs into the lungs every 6 (six) hours as needed for wheezing or shortness of breath., Disp: 6.7 g, Rfl: 1   dupilumab  (DUPIXENT ) 300 MG/2ML prefilled syringe, Inject 300 mg into the skin every 14 (fourteen) days., Disp: 4 mL, Rfl: 11   Famotidine  (PEPCID  PO), Take by mouth., Disp: , Rfl:    fluticasone  (FLONASE  ALLERGY RELIEF) 50 MCG/ACT nasal spray, Place 2 sprays into both nostrils daily., Disp: 16 g,  Rfl: 5   fluticasone -salmeterol (ADVAIR ) 100-50 MCG/ACT AEPB, Inhale 1 puff into the lungs 2 (two) times daily., Disp: 60 each, Rfl: 5   levocetirizine (XYZAL ) 5 MG tablet, Take 1 tablet (5 mg total) by mouth every evening., Disp: 90 tablet, Rfl: 1   montelukast  (SINGULAIR ) 10 MG tablet, Take 1 tablet (10 mg total) by mouth at bedtime., Disp: 90 tablet, Rfl: 1  Observations/Objective: Patient is well-developed, well-nourished in no acute distress.  Resting comfortably at home.  Head is normocephalic, atraumatic.  No labored breathing.  Speech is clear and coherent with logical content.  Patient is alert and oriented at baseline.    Assessment and Plan: 1. Mild intermittent asthma with exacerbation (Primary)  Antibiotic and prednisone  prescribed.  Supportive care discussed.  Pt does not need a refill of inhaler.   Follow Up Instructions: I discussed the assessment and treatment plan with the patient. The patient was provided an opportunity to ask questions and all were answered. The patient agreed with the plan and demonstrated an understanding of the instructions.  A copy of instructions were sent to the patient via MyChart unless otherwise noted below.     The patient was advised to call back or seek an in-person evaluation if the symptoms worsen or if the condition fails to improve as anticipated.    Harlene PEDLAR Ward, PA-C

## 2024-09-28 ENCOUNTER — Other Ambulatory Visit: Payer: Self-pay

## 2024-09-28 ENCOUNTER — Other Ambulatory Visit (HOSPITAL_COMMUNITY): Payer: Self-pay

## 2024-09-28 ENCOUNTER — Ambulatory Visit: Admitting: Allergy & Immunology

## 2024-09-28 VITALS — BP 102/80 | HR 83 | Temp 98.3°F | Resp 18 | Ht 66.0 in | Wt 184.2 lb

## 2024-09-28 DIAGNOSIS — J339 Nasal polyp, unspecified: Secondary | ICD-10-CM | POA: Diagnosis not present

## 2024-09-28 DIAGNOSIS — J4540 Moderate persistent asthma, uncomplicated: Secondary | ICD-10-CM | POA: Diagnosis not present

## 2024-09-28 DIAGNOSIS — J302 Other seasonal allergic rhinitis: Secondary | ICD-10-CM | POA: Diagnosis not present

## 2024-09-28 DIAGNOSIS — J3089 Other allergic rhinitis: Secondary | ICD-10-CM

## 2024-09-28 MED ORDER — OMEPRAZOLE 40 MG PO CPDR
40.0000 mg | DELAYED_RELEASE_CAPSULE | Freq: Every day | ORAL | 0 refills | Status: AC
  Filled 2024-09-28: qty 30, 30d supply, fill #0

## 2024-09-28 MED ORDER — PREDNISONE 10 MG PO TABS
ORAL_TABLET | ORAL | 0 refills | Status: AC
  Filled 2024-09-28: qty 36, 12d supply, fill #0

## 2024-09-28 NOTE — Patient Instructions (Addendum)
 1. Moderate persistent asthma, uncomplicated - Lung looked great today.  - We are going to extend your prednisone  taper for a longer period of time.  - Daily controller medication(s): Singulair  10mg  daily and Advair  100/50 one puff once daily and Dupixent  every 2 weeks - Prior to physical activity: albuterol  2 puffs 10-15 minutes before physical activity. - Changes during respiratory infections or worsening symptoms: Increase Advair  100/5 to 1 puff twice daily for TWO WEEKS. - Asthma control goals:  * Full participation in all desired activities (may need albuterol  before activity) * Albuterol  use two time or less a week on average (not counting use with activity) * Cough interfering with sleep two time or less a month * Oral steroids no more than once a year * No hospitalizations  2. Seasonal and perennial allergic rhinitis (mouse, israel pig, weeds, grasses, outdoor molds and dust mites) - Continue levocetirizine 5 mg daily. - Continue with montelukast  10mg  daily.  3. Nasal polyposis - Continue Dupixent  as above - Continue Xhance  one spray per nostril twice daily.  4. Viral URI - Add on a stronger GERD medication (the prednisone  might have flared your reflux which is irritating your airways). - Start omeprazole 40mg  daily for one month. - We are extending your prednisone  taper and weaning over two weeks.   4. Follow up as scheduled with Dr. Tobie.   Please inform us  of any Emergency Department visits, hospitalizations, or changes in symptoms. Call us  before going to the ED for breathing or allergy symptoms since we might be able to fit you in for a sick visit. Feel free to contact us  anytime with any questions, problems, or concerns.  It was a pleasure to see you again today!  Websites that have reliable patient information: 1. American Academy of Asthma, Allergy, and Immunology: www.aaaai.org 2. Food Allergy Research and Education (FARE): foodallergy.org 3. Mothers of  Asthmatics: http://www.asthmacommunitynetwork.org 4. American College of Allergy, Asthma, and Immunology: www.acaai.org      "Like" us  on Facebook and Instagram for our latest updates!      A healthy democracy works best when [employer] participate! Make sure you are registered to vote! If you have moved or changed any of your contact information, you will need to get this updated before voting! Scan the QR codes below to learn more!

## 2024-09-28 NOTE — Progress Notes (Signed)
 FOLLOW UP  Date of Service/Encounter:  09/28/24   Assessment:   Moderate persistent asthma, uncomplicated - with >10 rounds of prednisone  in the last 18 months, markedly improved on Dupixent    Seasonal and perennial allergic rhinitis (mouse, israel pig, weeds, grasses, outdoor molds and dust mites)   Nasal polyposis - s/p polypectomy    Plan/Recommendations:   1. Moderate persistent asthma, uncomplicated - Lung looked great today.  - We are going to extend your prednisone  taper for a longer period of time.  - Daily controller medication(s): Singulair  10mg  daily and Advair  100/50 one puff once daily and Dupixent  every 2 weeks - Prior to physical activity: albuterol  2 puffs 10-15 minutes before physical activity. - Changes during respiratory infections or worsening symptoms: Increase Advair  100/5 to 1 puff twice daily for TWO WEEKS. - Asthma control goals:  * Full participation in all desired activities (may need albuterol  before activity) * Albuterol  use two time or less a week on average (not counting use with activity) * Cough interfering with sleep two time or less a month * Oral steroids no more than once a year * No hospitalizations  2. Seasonal and perennial allergic rhinitis (mouse, israel pig, weeds, grasses, outdoor molds and dust mites) - Continue levocetirizine 5 mg daily. - Continue with montelukast  10mg  daily.  3. Nasal polyposis - Continue Dupixent  as above - Continue Xhance  one spray per nostril twice daily.  4. Viral URI - Add on a stronger GERD medication (the prednisone  might have flared your reflux which is irritating your airways). - Start omeprazole 40mg  daily for one month. - We are extending your prednisone  taper and weaning over two weeks.   4. Follow up as scheduled with Dr. Tobie.   Subjective:   Sara Hamilton is a 38 y.o. female presenting today for follow up of  Chief Complaint  Patient presents with   Follow-up    Pt presents to the  office c/o cough. Has been sick for about 2 weeks. 5 days of prednisone , zpack and she is on the last of both meds. Feels better but afraid that it is gonna stay and not completely go away. She does not think she should do the spiro due to throat is irritated.     Sara Hamilton has a history of the following: Patient Active Problem List   Diagnosis Date Noted   Nasal polyposis 10/02/2021   Encounter for gynecological examination with Papanicolaou smear of cervix 08/07/2021   Cough variant asthma with ? UACS /cyclical cough component  07/04/2019   Upper airway cough syndrome 05/01/2018   Seasonal and perennial allergic rhinitis 04/28/2018   Moderate persistent asthma, uncomplicated 04/28/2018   Contraceptive management 11/07/2013   Supervision of normal pregnancy 07/02/2011    History obtained from: chart review and patient.  Discussed the use of AI scribe software for clinical note transcription with the patient and/or guardian, who gave verbal consent to proceed.  Sara Hamilton is a 38 y.o. female presenting for a follow up visit.  She was last seen in August 2025.  At that time, she was doing well on Singulair  as well as Advair  100 mcg 1 puff once daily and Dupixent .  For her rhinitis, she was doing well with her Flonase  and her Dupixent .  We have stop her allergy shots for long-term control.  Since last visit, she has done relatively well, but she is having some issues with her asthma recently.  She has been experiencing a persistent cough and airway irritation  for the past few weeks, which began after her son brought home an illness. The cough has not improved significantly despite treatment with Mucinex  and Robitussin. She completed a course of a Z-Pak and a five-day course of prednisone , which provided some relief, but the symptoms have not fully resolved.  She describes difficulty taking deep breaths due to airway irritation, particularly when lying down. No congestion or stuffy nose is  present, unlike her son's symptoms. She denies fever and significant mucus production but reports hoarseness.  Asthma/Respiratory Symptom History: Asthma has otherwise been well-controlled on her current asthma regimen, including Advair  100/5, without any recent wheezing or shortness of breath. She does not use a nebulizer at home but has albuterol  available if needed. Her current medications include Dupixent , which has been life-changing for her asthma.  Allergic Rhinitis Symptom History: She has a history of nasal polyps and underwent surgery for a deviated septum and polyp removal, which included cutting a piece of bone to aid drainage. She reports no current issues with polyps. Dupixent  has been very helpful for her.   GERD Symptom History: She remains on famotidine  20 mg at night for reflux.   Infection Symptom History: She has not required antibiotics or prednisone  for a couple of years prior to this episode.  Otherwise, there have been no changes to her past medical history, surgical history, family history, or social history.    Review of systems otherwise negative other than that mentioned in the HPI.    Objective:   Blood pressure 102/80, pulse 83, temperature 98.3 F (36.8 C), temperature source Temporal, resp. rate 18, height 5' 6 (1.676 m), weight 184 lb 3.2 oz (83.6 kg), SpO2 96%. Body mass index is 29.73 kg/m.    Physical Exam Vitals reviewed.  Constitutional:      Appearance: She is well-developed.     Comments: Very pleasant. Delightful. Speaking in full sentences. Intermittent dry coughing.   HENT:     Head: Normocephalic and atraumatic.     Right Ear: Tympanic membrane, ear canal and external ear normal. No drainage, swelling or tenderness. Tympanic membrane is not injected, scarred, erythematous, retracted or bulging.     Left Ear: Tympanic membrane, ear canal and external ear normal. No drainage, swelling or tenderness. Tympanic membrane is not injected,  scarred, erythematous, retracted or bulging.     Nose: No nasal deformity, septal deviation, mucosal edema or rhinorrhea.     Right Turbinates: Enlarged, swollen and pale.     Left Turbinates: Enlarged, swollen and pale.     Right Sinus: No maxillary sinus tenderness or frontal sinus tenderness.     Left Sinus: No maxillary sinus tenderness or frontal sinus tenderness.     Comments: Turbinates are somewhat enlarged.  No polyps appreciated.    Mouth/Throat:     Mouth: Mucous membranes are not pale and not dry.     Pharynx: Uvula midline.  Eyes:     General:        Right eye: No discharge.        Left eye: No discharge.     Conjunctiva/sclera: Conjunctivae normal.     Right eye: Right conjunctiva is not injected. No chemosis.    Left eye: Left conjunctiva is not injected. No chemosis.    Pupils: Pupils are equal, round, and reactive to light.  Cardiovascular:     Rate and Rhythm: Normal rate and regular rhythm.     Heart sounds: Normal heart sounds.  Pulmonary:  Effort: Pulmonary effort is normal. No tachypnea, accessory muscle usage or respiratory distress.     Breath sounds: Normal breath sounds. No wheezing, rhonchi or rales.     Comments: Moving air well in all lung fields. She does have some coughing when taking deeper breaths.  Chest:     Chest wall: No tenderness.  Abdominal:     Tenderness: There is no abdominal tenderness. There is no guarding or rebound.  Lymphadenopathy:     Head:     Right side of head: No submandibular, tonsillar or occipital adenopathy.     Left side of head: No submandibular, tonsillar or occipital adenopathy.     Cervical: No cervical adenopathy.  Skin:    General: Skin is warm.     Capillary Refill: Capillary refill takes less than 2 seconds.     Coloration: Skin is not pale.     Findings: No abrasion, erythema, petechiae or rash. Rash is not papular, urticarial or vesicular.  Neurological:     Mental Status: She is alert.  Psychiatric:         Behavior: Behavior is cooperative.      Diagnostic studies:    Spirometry: results normal (FEV1: 2.89/86%, FVC: 3.44/84%, FEV1/FVC: 84%).    Spirometry consistent with normal pattern.    Allergy Studies: none       Marty Shaggy, MD  [facility] 

## 2024-10-03 ENCOUNTER — Encounter: Payer: Self-pay | Admitting: Allergy & Immunology

## 2024-10-13 ENCOUNTER — Other Ambulatory Visit: Payer: Self-pay

## 2024-10-13 NOTE — Progress Notes (Signed)
 Specialty Pharmacy Refill Coordination Note  Sara Hamilton is a 38 y.o. female contacted today regarding refills of specialty medication(s) Dupilumab  (Dupixent )   Patient requested Delivery   Delivery date: 10/21/24   Verified address: [address] [place], [place] [address]   Medication will be filled on: 10/20/24

## 2024-10-19 ENCOUNTER — Other Ambulatory Visit (HOSPITAL_COMMUNITY): Payer: Self-pay

## 2024-10-20 ENCOUNTER — Other Ambulatory Visit: Payer: Self-pay

## 2024-11-15 ENCOUNTER — Other Ambulatory Visit (HOSPITAL_COMMUNITY): Payer: Self-pay

## 2024-11-17 ENCOUNTER — Other Ambulatory Visit: Payer: Self-pay

## 2024-11-17 ENCOUNTER — Other Ambulatory Visit: Payer: Self-pay | Admitting: Pharmacy Technician

## 2024-11-17 ENCOUNTER — Other Ambulatory Visit (HOSPITAL_BASED_OUTPATIENT_CLINIC_OR_DEPARTMENT_OTHER): Payer: Self-pay

## 2024-11-17 NOTE — Progress Notes (Signed)
 Specialty Pharmacy Refill Coordination Note  Sara Hamilton is a 38 y.o. female contacted today regarding refills of specialty medication(s)Dupilumab  (Dupixent )    Patient requested (Patient-Rptd) Delivery   Delivery date:11/22/2024 Verified address: (Patient-Rptd) [address] [place] [address]   Medication will be filled on: 11/21/2024

## 2024-11-21 ENCOUNTER — Other Ambulatory Visit (HOSPITAL_COMMUNITY): Payer: Self-pay

## 2024-11-21 ENCOUNTER — Other Ambulatory Visit: Payer: Self-pay

## 2024-11-22 ENCOUNTER — Other Ambulatory Visit (HOSPITAL_COMMUNITY): Payer: Self-pay

## 2024-11-22 ENCOUNTER — Other Ambulatory Visit: Payer: Self-pay

## 2024-11-22 NOTE — Progress Notes (Signed)
 The patient's medication was not Ready to Dispense by the [employer] cut-off time of 1:30 PM. The new delivery date is scheduled for Thursday, 11/24/24, in preparation for her injection on 11/24/24. The patient is an active MyChart user and was notified via [employer].

## 2024-11-23 ENCOUNTER — Other Ambulatory Visit (HOSPITAL_COMMUNITY): Payer: Self-pay

## 2024-12-13 ENCOUNTER — Other Ambulatory Visit (HOSPITAL_COMMUNITY): Payer: Self-pay

## 2024-12-13 ENCOUNTER — Other Ambulatory Visit: Payer: Self-pay

## 2024-12-13 NOTE — Progress Notes (Signed)
 Specialty Pharmacy Refill Coordination Note  Sara Hamilton is a 38 y.o. female contacted today regarding refills of specialty medication(s) Dupilumab  (Dupixent )   Patient requested Delivery   Delivery date: 12/20/24   Verified address: [address] [place] [address]   Medication will be filled on: 12/19/24

## 2024-12-19 ENCOUNTER — Other Ambulatory Visit (HOSPITAL_COMMUNITY): Payer: Self-pay

## 2024-12-19 ENCOUNTER — Other Ambulatory Visit (HOSPITAL_BASED_OUTPATIENT_CLINIC_OR_DEPARTMENT_OTHER): Payer: Self-pay

## 2024-12-19 ENCOUNTER — Other Ambulatory Visit: Payer: Self-pay

## 2024-12-20 ENCOUNTER — Other Ambulatory Visit (HOSPITAL_COMMUNITY): Payer: Self-pay

## 2024-12-21 ENCOUNTER — Other Ambulatory Visit: Payer: Self-pay

## 2024-12-21 ENCOUNTER — Other Ambulatory Visit: Payer: Self-pay | Admitting: Pharmacist

## 2024-12-21 NOTE — Progress Notes (Signed)
 Specialty Pharmacy Ongoing Clinical Assessment Note  Sara Hamilton is a 39 y.o. female who is being followed by the specialty pharmacy service for RxSp Asthma/COPD   Patient's specialty medication(s) reviewed today: Dupilumab  (Dupixent )   Missed doses in the last 4 weeks: 0   Patient/Caregiver did not have any additional questions or concerns.   Therapeutic benefit summary: Patient is achieving benefit   Adverse events/side effects summary: No adverse events/side effects   Patient's therapy is appropriate to: Continue    Goals Addressed             This Visit's Progress    Minimize recurrence of flares   On track    Patient is on track. Patient will maintain adherence. [name] reports she is well-controlled at this time with no recent flares.         Follow up: 12 months  Lyle LELON Chalk Specialty Pharmacist

## 2025-01-06 ENCOUNTER — Other Ambulatory Visit (HOSPITAL_COMMUNITY): Payer: Self-pay

## 2025-01-09 ENCOUNTER — Other Ambulatory Visit: Payer: Self-pay

## 2025-01-10 ENCOUNTER — Other Ambulatory Visit (HOSPITAL_COMMUNITY): Payer: Self-pay

## 2025-01-10 ENCOUNTER — Other Ambulatory Visit: Payer: Self-pay

## 2025-01-12 ENCOUNTER — Other Ambulatory Visit: Payer: Self-pay

## 2025-01-12 NOTE — Progress Notes (Signed)
 Specialty Pharmacy Refill Coordination Note  Sara Hamilton is a 39 y.o. female contacted today regarding refills of specialty medication(s) Dupilumab  (Dupixent )   Patient requested Delivery   Delivery date: 01/18/25   Verified address: [address] [place] [address]   Medication will be filled on: 01/17/25

## 2025-01-17 ENCOUNTER — Other Ambulatory Visit: Payer: Self-pay

## 2025-01-23 ENCOUNTER — Ambulatory Visit: Admitting: Family Medicine

## 2025-02-01 ENCOUNTER — Ambulatory Visit: Admitting: Family Medicine
